# Patient Record
Sex: Male | Born: 1955
Health system: Southern US, Community
[De-identification: ages and names within clinical notes are randomized; demographics above are authoritative.]

## PROBLEM LIST (undated history)

## (undated) DIAGNOSIS — F411 Generalized anxiety disorder: Secondary | ICD-10-CM

## (undated) DIAGNOSIS — E781 Pure hyperglyceridemia: Secondary | ICD-10-CM

## (undated) DIAGNOSIS — I10 Essential (primary) hypertension: Secondary | ICD-10-CM

## (undated) DIAGNOSIS — K449 Diaphragmatic hernia without obstruction or gangrene: Secondary | ICD-10-CM

## (undated) HISTORY — DX: Generalized anxiety disorder: F41.1

## (undated) HISTORY — DX: Essential (primary) hypertension: I10

## (undated) HISTORY — DX: Diaphragmatic hernia without obstruction or gangrene: K44.9

## (undated) HISTORY — DX: Pure hyperglyceridemia: E78.1

---

## 2011-02-06 ENCOUNTER — Encounter: Payer: Self-pay | Admitting: Family Medicine

## 2011-02-06 DIAGNOSIS — I10 Essential (primary) hypertension: Secondary | ICD-10-CM | POA: Insufficient documentation

## 2011-10-02 ENCOUNTER — Ambulatory Visit (INDEPENDENT_AMBULATORY_CARE_PROVIDER_SITE_OTHER): Payer: BC Managed Care – PPO | Admitting: Family Medicine

## 2011-10-02 VITALS — BP 152/74 | HR 98 | Temp 97.9°F | Resp 18 | Ht 66.5 in | Wt 175.0 lb

## 2011-10-02 DIAGNOSIS — R3911 Hesitancy of micturition: Secondary | ICD-10-CM

## 2011-10-02 DIAGNOSIS — R3915 Urgency of urination: Secondary | ICD-10-CM

## 2011-10-02 LAB — POCT UA - MICROSCOPIC ONLY
Mucus, UA: POSITIVE
WBC, Ur, HPF, POC: NEGATIVE

## 2011-10-02 LAB — POCT URINALYSIS DIPSTICK
Bilirubin, UA: NEGATIVE
Ketones, UA: NEGATIVE
Leukocytes, UA: NEGATIVE
pH, UA: 7

## 2011-10-02 NOTE — Progress Notes (Signed)
  Subjective:    Patient ID: Paul Hensley, male    DOB: 1955/12/30, 56 y.o.   MRN: [id number]  HPI Patient presents for evaluation of polyuria. Polyuria longstanding "all my life" Recently with urge symptoms and sensation of incomplete emptying Urinates scant amount  Started on finasteride 2 months ago with initial improvement in symptoms. Prostate exam done at that time patient reports as normal   Review of Systems  Constitutional: Negative for fever, chills and fatigue.  Genitourinary: Positive for frequency. Negative for dysuria, hematuria, flank pain and discharge.       Objective:   Physical Exam  Constitutional: He appears well-developed.  HENT:  Mouth/Throat: Oropharynx is clear and moist.  Neck: Neck supple.  Cardiovascular: Normal rate, regular rhythm and normal heart sounds.   Pulmonary/Chest: Effort normal and breath sounds normal.  Abdominal: Soft. Bowel sounds are normal.  Genitourinary: Prostate normal. Prostate is not enlarged and not tender.  Musculoskeletal: Normal range of motion.      Results for orders placed in visit on 10/02/11  POCT UA - MICROSCOPIC ONLY      Component Value Range   WBC, Ur, HPF, POC negative     RBC, urine, microscopic 0-2     Bacteria, U Microscopic negative     Mucus, UA positive     Epithelial cells, urine per micros 0-1     Crystals, Ur, HPF, POC negative     Casts, Ur, LPF, POC negative     Yeast, UA negative    POCT URINALYSIS DIPSTICK      Component Value Range   Color, UA yellow     Clarity, UA clear     Glucose, UA negative     Bilirubin, UA negative     Ketones, UA negative     Spec Grav, UA 1.015     Blood, UA trace-lysed     pH, UA 7.0     Protein, UA negative     Urobilinogen, UA 0.2     Nitrite, UA negative     Leukocytes, UA Negative    PSA      Component Value Range   PSA 0.69  <=4.00 (ng/mL)       Assessment & Plan:   1. Urinary urgency  POCT UA - Microscopic Only, POCT urinalysis dipstick,  PSA   Trial of Flomax to see if symptoms secondary to obstruction.  If no relief patient will need to see Urology for cystoscopy to discern etiology of irritative voiding symptoms.  Patient in agreement with plan.

## 2011-10-03 LAB — PSA: PSA: 0.69 ng/mL (ref <=4.00)

## 2011-10-03 MED ORDER — TAMSULOSIN HCL 0.4 MG PO CAPS: 0.4000 mg | ORAL_CAPSULE | Freq: Every day | ORAL | Status: DC

## 2011-10-06 ENCOUNTER — Telehealth: Payer: Self-pay

## 2011-10-06 NOTE — Telephone Encounter (Signed)
Dr. Hal Hope gave pt Flomax the other day during OV. He would like to if he can take his other med (Finasteride) with Flomax. He stated that he was uncomfortable.

## 2011-10-06 NOTE — Telephone Encounter (Signed)
Yes ok to take.  Paul Hensley

## 2011-10-06 NOTE — Telephone Encounter (Signed)
Spoke with pt advised its ok to take both meds. Pt understood

## 2012-10-24 ENCOUNTER — Ambulatory Visit: Payer: BC Managed Care – PPO

## 2012-11-24 ENCOUNTER — Encounter: Payer: Self-pay | Admitting: Family Medicine

## 2012-11-24 ENCOUNTER — Ambulatory Visit (INDEPENDENT_AMBULATORY_CARE_PROVIDER_SITE_OTHER): Payer: BC Managed Care – PPO | Admitting: Family Medicine

## 2012-11-24 VITALS — BP 120/70 | HR 80 | Temp 97.9°F | Resp 20 | Ht 66.0 in | Wt 173.0 lb

## 2012-11-24 DIAGNOSIS — E785 Hyperlipidemia, unspecified: Secondary | ICD-10-CM

## 2012-11-24 DIAGNOSIS — I10 Essential (primary) hypertension: Secondary | ICD-10-CM

## 2012-11-24 DIAGNOSIS — N400 Enlarged prostate without lower urinary tract symptoms: Secondary | ICD-10-CM

## 2012-11-24 DIAGNOSIS — M722 Plantar fascial fibromatosis: Secondary | ICD-10-CM

## 2012-11-24 DIAGNOSIS — L259 Unspecified contact dermatitis, unspecified cause: Secondary | ICD-10-CM

## 2012-11-24 DIAGNOSIS — E781 Pure hyperglyceridemia: Secondary | ICD-10-CM | POA: Insufficient documentation

## 2012-11-24 LAB — CBC WITH DIFFERENTIAL/PLATELET
Basophils Relative: 0 % (ref 0–1)
Eosinophils Absolute: 0.3 10*3/uL (ref 0.0–0.7)
Eosinophils Relative: 2 % (ref 0–5)
HCT: 42.6 % (ref 39.0–52.0)
Hemoglobin: 14.9 g/dL (ref 13.0–17.0)
MCH: 28.7 pg (ref 26.0–34.0)
MCHC: 35 g/dL (ref 30.0–36.0)
MCV: 81.9 fL (ref 78.0–100.0)
Monocytes Absolute: 0.7 10*3/uL (ref 0.1–1.0)
Monocytes Relative: 6 % (ref 3–12)
Neutrophils Relative %: 79 % — ABNORMAL HIGH (ref 43–77)

## 2012-11-24 LAB — BASIC METABOLIC PANEL
CO2: 29 mEq/L (ref 19–32)
Chloride: 97 mEq/L (ref 96–112)
Creat: 1 mg/dL (ref 0.50–1.35)
Potassium: 4.1 mEq/L (ref 3.5–5.3)

## 2012-11-24 LAB — HEPATIC FUNCTION PANEL
ALT: 21 U/L (ref 0–53)
AST: 19 U/L (ref 0–37)
Albumin: 4.3 g/dL (ref 3.5–5.2)
Alkaline Phosphatase: 68 U/L (ref 39–117)
Total Protein: 6.3 g/dL (ref 6.0–8.3)

## 2012-11-24 LAB — PSA: PSA: 1.3 ng/mL (ref <=4.00)

## 2012-11-24 LAB — LIPID PANEL: Cholesterol: 140 mg/dL (ref 0–200)

## 2012-11-24 MED ORDER — MOMETASONE FUROATE 0.1 % EX OINT: TOPICAL_OINTMENT | Freq: Every day | CUTANEOUS | Status: DC

## 2012-11-24 MED ORDER — SIMVASTATIN 20 MG PO TABS: 20.0000 mg | ORAL_TABLET | Freq: Every day | ORAL | Status: DC

## 2012-11-24 MED ORDER — FINASTERIDE 1 MG PO TABS: 5.0000 mg | ORAL_TABLET | Freq: Every day | ORAL | Status: DC

## 2012-11-24 MED ORDER — LISINOPRIL-HYDROCHLOROTHIAZIDE 20-12.5 MG PO TABS: 1.0000 | ORAL_TABLET | Freq: Every day | ORAL | Status: DC

## 2012-11-24 NOTE — Progress Notes (Signed)
Subjective:    Patient ID: Paul Hensley, male    DOB: 02/01/1956, 57 y.o.   MRN: [id number]  HPI  Patient reports that 2 weeks ago he was bit on the back of his neck by some type of insect..  he felt a sharp stinging pain. Ever since he has had an itchy red rash. It is 1 cm in diameter. It is red scaly skin which appears like eczema.  He's tried topical calamine lotion without benefit.  He also has hypertension. He can't take fosinopril 20 mg by mouth daily and hydrochlorothiazide 25 mg by mouth daily. He denies any chest pain, shortness of breath, or dyspnea on exertion.  He also has hyperlipidemia. He currently takes Zocor 20 mg by mouth daily. He denies any myalgias or right upper quadrant pain.  He reports chronic pain in his left foot. It is located on the plantar aspect of his foot near the calcaneus. It is worse with prolonged standing on ladders. It is worse with prolonged walking. He's tried orthotics factors without benefit. 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 Past Medical History  Diagnosis Date  . Hypertension   . Hiatal hernia   . GAD (generalized anxiety disorder)   . High triglycerides    Current Outpatient Prescriptions on File Prior to Visit  Medication Sig Dispense Refill  . fish oil-omega-3 fatty acids 1000 MG capsule Take 2 g by mouth daily.        . fluocinonide (LIDEX) 0.05 % cream Apply topically 2 (two) times daily.        . mometasone (ELOCON) 0.1 % cream Apply topically daily.        Marland Kitchen omeprazole (PRILOSEC) 20 MG capsule Take 20 mg by mouth daily.         No current facility-administered medications on file prior to visit.   Allergies  Allergen Reactions  . Effexor (Venlafaxine Hydrochloride)      Review of Systems  All other systems reviewed and are negative.       Objective:   Physical Exam  Constitutional: He appears well-developed and well-nourished.  Neck: Normal range of motion. Neck supple. No thyromegaly present.   Cardiovascular: Normal rate, regular rhythm and normal heart sounds.  Exam reveals no gallop and no friction rub.   No murmur heard. Pulmonary/Chest: Effort normal and breath sounds normal. No respiratory distress. He has no wheezes. He has no rales. He exhibits no tenderness.  Abdominal: Soft. Bowel sounds are normal. He exhibits no distension. There is no tenderness. There is no rebound and no guarding.  Musculoskeletal: Normal range of motion. He exhibits tenderness. He exhibits no edema.  Lymphadenopathy:    He has no cervical adenopathy.   he is tender palpation over the medial plantar aspect of the left calcaneus near the insertion of a plantar fascia.        Assessment & Plan:  1. Contact dermatitis Elocon 0.1% ointment to be applied daily for 7-10 days to the back of his neck. If the lesion persists would recommend a skin biopsy versus a KOH. 2. BPH (benign prostatic hyperplasia) Patient has mild BPH. He mainly takes finasteride for male pattern baldness. His only taking 1 mg a day. I will recheck a PSA for cancer screening. The patient refuses a rectal exam today. - PSA  3. HTN (hypertension) Patient's blood pressures well controlled continue the current medications the - Basic Metabolic Panel - CBC with Differential - Hepatic Function Panel - Lipid Panel  4. HLD (hyperlipidemia) Check fasting lipid panel. Goal LDL is less than 1:30.  5. Plantar fasciitis of left foot Offered the patient a cortisone injection in the left heel. He would like to come back Friday when he is  not having to work all day after the injection. We can perform it at that time.

## 2012-12-05 ENCOUNTER — Encounter: Payer: Self-pay | Admitting: Family Medicine

## 2012-12-05 ENCOUNTER — Ambulatory Visit (INDEPENDENT_AMBULATORY_CARE_PROVIDER_SITE_OTHER): Payer: BC Managed Care – PPO | Admitting: Family Medicine

## 2012-12-05 VITALS — BP 120/60 | HR 78 | Temp 98.1°F | Resp 16 | Wt 172.0 lb

## 2012-12-05 DIAGNOSIS — M722 Plantar fascial fibromatosis: Secondary | ICD-10-CM

## 2012-12-05 NOTE — Progress Notes (Signed)
  Subjective:    Patient ID: Paul Hensley, male    DOB: 1955/10/23, 57 y.o.   MRN: [id number]  HPI  He reports chronic pain in his left foot. It is located on the plantar aspect of his foot near the calcaneus. It is worse with prolonged standing on ladders. It is worse with prolonged walking. He's tried orthotics factors without benefit. 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  He is here to get the cortisone injection in his left heel that we discussed at last visit. Past Medical History  Diagnosis Date  . Hypertension   . Hiatal hernia   . GAD (generalized anxiety disorder)   . High triglycerides    Current Outpatient Prescriptions on File Prior to Visit  Medication Sig Dispense Refill  . finasteride (PROPECIA) 1 MG tablet Take 5 tablets (5 mg total) by mouth daily.  30 tablet  11  . fish oil-omega-3 fatty acids 1000 MG capsule Take 2 g by mouth daily.        Marland Kitchen lisinopril-hydrochlorothiazide (ZESTORETIC) 20-12.5 MG per tablet Take 1 tablet by mouth daily.  30 tablet  11  . mometasone (ELOCON) 0.1 % ointment Apply topically daily.  45 g  0  . omeprazole (PRILOSEC) 20 MG capsule Take 20 mg by mouth daily.        . simvastatin (ZOCOR) 20 MG tablet Take 1 tablet (20 mg total) by mouth at bedtime.  30 tablet  11   No current facility-administered medications on file prior to visit.   Allergies  Allergen Reactions  . Effexor (Venlafaxine Hydrochloride)       Review of Systems  All other systems reviewed and are negative.       Objective:   Physical Exam  Vitals reviewed. Cardiovascular: Normal rate and regular rhythm.   No murmur heard. Pulmonary/Chest: Effort normal and breath sounds normal.  Musculoskeletal: He exhibits tenderness.  tender to palpation over the left calcaneus near the plantar fascia insertion.        Assessment & Plan:  1. Plantar fasciitis of left foot Using sterile technique a combination of 1 cc lidocaine and 1 cc of 40 mg per mL  Kenalog was injected near the point of maximum tenderness on the inner aspect of his left calcaneus. The patient tolerated procedure well. The wound is dressed with a Band-Aid. Wound precautions were given. Followup in one to 2 weeks.

## 2012-12-12 ENCOUNTER — Ambulatory Visit: Payer: Self-pay

## 2012-12-12 ENCOUNTER — Other Ambulatory Visit: Payer: Self-pay | Admitting: Occupational Medicine

## 2012-12-12 DIAGNOSIS — R52 Pain, unspecified: Secondary | ICD-10-CM

## 2013-04-06 ENCOUNTER — Telehealth: Payer: Self-pay | Admitting: Family Medicine

## 2013-04-06 NOTE — Telephone Encounter (Signed)
Switch to pravastatin 40 qd.

## 2013-04-06 NOTE — Telephone Encounter (Signed)
Insurance will no longer pay for Simvastatin. Can we change to something cheaper?

## 2013-04-07 MED ORDER — PRAVASTATIN SODIUM 40 MG PO TABS: 40.0000 mg | ORAL_TABLET | Freq: Every day | ORAL | Status: DC

## 2013-04-07 NOTE — Telephone Encounter (Signed)
.  Rx sent to pharm and pt aware per vm

## 2013-07-07 ENCOUNTER — Ambulatory Visit (INDEPENDENT_AMBULATORY_CARE_PROVIDER_SITE_OTHER): Payer: 59 | Admitting: Internal Medicine

## 2013-07-07 VITALS — BP 132/64 | HR 83 | Resp 16 | Ht 67.5 in | Wt 168.0 lb

## 2013-07-07 DIAGNOSIS — R300 Dysuria: Secondary | ICD-10-CM

## 2013-07-07 DIAGNOSIS — R110 Nausea: Secondary | ICD-10-CM

## 2013-07-07 DIAGNOSIS — G4700 Insomnia, unspecified: Secondary | ICD-10-CM

## 2013-07-07 DIAGNOSIS — N400 Enlarged prostate without lower urinary tract symptoms: Secondary | ICD-10-CM

## 2013-07-07 DIAGNOSIS — J111 Influenza due to unidentified influenza virus with other respiratory manifestations: Secondary | ICD-10-CM

## 2013-07-07 LAB — POCT URINALYSIS DIPSTICK
Blood, UA: NEGATIVE
Glucose, UA: NEGATIVE
Ketones, UA: >=160
Leukocytes, UA: NEGATIVE
Nitrite, UA: NEGATIVE

## 2013-07-07 LAB — POCT UA - MICROSCOPIC ONLY

## 2013-07-07 MED ORDER — TAMSULOSIN HCL 0.4 MG PO CAPS: 0.4000 mg | ORAL_CAPSULE | Freq: Every day | ORAL | Status: DC

## 2013-07-07 MED ORDER — ONDANSETRON HCL 4 MG PO TABS: 4.0000 mg | ORAL_TABLET | Freq: Three times a day (TID) | ORAL | Status: DC | PRN

## 2013-07-07 NOTE — Progress Notes (Addendum)
This chart was scribed for Ellamae Sia, MD by Joaquin Music, ED Scribe. This patient was seen in room Room/bed 4 and the patient's care was started at 11:58 AM. Subjective:    Patient ID: Paul Hensley, male    DOB: 25-Mar-1956, 57 y.o.   MRN: [id number] Chief Complaint  Patient presents with  . GI Problem   GI Problem The primary symptoms include dysuria. Primary symptoms do not include fever.   ED MANDICH is a 57 y.o. male who presents to [facility] complaining of illness for the last 5 days. Pt states he initially began having HA, sinus congestiong and cough, all of which have resolved. Pt states he has now noticed he has been having urinary symptoms. He states he has been having dysuria, trouble starting/stopping his stream with infrequent voiding due to decrease fluid intake. He has lost his appetite over the last 72 hours and has had frequent nausea without vomiting for the last 2 days.  Pt denies fever,  and diarrhea.  Pt has a hx of similar voiding symptoms that occured a year ago. He states he was referred to [facility] and was informed he had excessive intake of caffeine with BPH. He improved with Flomax and changing his diet. Pt states he has done well since then with regular fluids and avoiding caffeine until recent symptoms.  He also complains of insomnia. He has delayed sleep onset insisting as are having his mind calmed down. He lives with his wife but says it is a lot happier at work than he is at home. He has tried Benadryl without success .  History   Social History  . Marital Status: Married    Spouse Name: N/A    Number of Children: N/A  . Years of Education: N/A   Social History Main Topics  . Smoking status: Former Smoker    Types: Cigarettes    Quit date: 10/02/1971  . Smokeless tobacco: Never Used  . Alcohol Use: No  . Drug Use: No  . Sexual Activity: None   Other Topics Concern  . None   Social History Narrative  . None   No past  surgical history on file.  No family history on file. Patient Active Problem List   Diagnosis Date Noted  . BPH (benign prostatic hyperplasia) 07/07/2013  . High triglycerides   . Hypertension     Current outpatient prescriptions:finasteride (PROPECIA) 1 MG tablet, Take 5 tablets (5 mg total) by mouth daily., Disp: 30 tablet, Rfl: 11;  fish oil-omega-3 fatty acids 1000 MG capsule, Take 2 g by mouth daily.  , Disp: , Rfl: ;  lisinopril-hydrochlorothiazide (ZESTORETIC) 20-12.5 MG per tablet, Take 1 tablet by mouth daily., Disp: 30 tablet, Rfl: 11;  omeprazole (PRILOSEC) 20 MG capsule, Take 20 mg by mouth daily.  , Disp: , Rfl:  pravastatin (PRAVACHOL) 40 MG tablet, Take 1 tablet (40 mg total) by mouth daily., Disp: 30 tablet, Rfl: 5  Review of Systems  Constitutional: Negative for fever and unexpected weight change.  Respiratory: Negative for chest tightness and shortness of breath.   Cardiovascular: Negative for chest pain and palpitations.  Genitourinary: Positive for dysuria, decreased urine volume and difficulty urinating.   Objective:   Physical Exam CONSTITUTIONAL: Well developed/well nourished HEAD: Normocephalic/atraumatic EYES: EOMI/PERRL/conjunctiva clear ENMT: Mucous membranes moist/no nasal discharge/oral pharynx clear NECK: supple no meningeal signs/no adenopathy SPINE:entire spine nontender CV: S1/S2 noted, no murmurs/rubs/gallops noted LUNGS: Lungs are clear to auscultation bilaterally, no apparent distress ABDOMEN: soft,  nontender, no rebound or guarding GU:no cva tenderness NEURO: Pt is awake/alert, moves all extremitiesx4 EXTREMITIES: pulses normal, full ROM SKIN: warm, color normal PSYCH: no abnormalities of mood noted  Results for orders placed in visit on 07/07/13  POCT URINALYSIS DIPSTICK      Result Value Range   Color, UA amber     Clarity, UA sl cloudy     Glucose, UA neg     Bilirubin, UA small     Ketones, UA >=160     Spec Grav, UA 1.025      Blood, UA neg     pH, UA 6.0     Protein, UA 30     Urobilinogen, UA 1.0     Nitrite, UA neg     Leukocytes, UA Negative    POCT UA - MICROSCOPIC ONLY      Result Value Range   WBC, Ur, HPF, POC 0-2     RBC, urine, microscopic 1-3     Bacteria, U Microscopic 1+     Mucus, UA positive'     Epithelial cells, urine per micros 0-1     Crystals, Ur, HPF, POC neg     Casts, Ur, LPF, POC neg     Yeast, UA neg      BP 132/64  Pulse 83  Resp 16  Ht 5' 7.5" (1.715 m)  Wt 168 lb (76.204 kg)  BMI 25.91 kg/m2  SpO2 99% Assessment & Plan:  Dysuria - Plan: POCT urinalysis dipstick, POCT UA - Microscopic Only  Influenza with other respiratory manifestations  Nausea alone  Insomnia  BPH (benign prostatic hyperplasia)  Meds ordered this encounter  Medications  . tamsulosin (FLOMAX) 0.4 MG CAPS capsule    Sig: Take 1 capsule (0.4 mg total) by mouth daily after supper.    Dispense:  10 capsule    Refill:  0  . ondansetron (ZOFRAN) 4 MG tablet    Sig: Take 1 tablet (4 mg total) by mouth every 8 (eight) hours as needed for nausea or vomiting.    Dispense:  10 tablet    Refill:  0   If not improved after one week to 2 weeks and advise recheck Trial of melatonin or valerian root at hs May need counseling intervention     I personally performed the services described in this documentation, which was scribed in my presence. The recorded information has been reviewed and is accurate.

## 2013-07-31 ENCOUNTER — Other Ambulatory Visit: Payer: Self-pay | Admitting: Family Medicine

## 2013-10-01 ENCOUNTER — Other Ambulatory Visit: Payer: Self-pay | Admitting: Family Medicine

## 2013-10-02 NOTE — Telephone Encounter (Signed)
Refill appropriate and filled per protocol. 

## 2013-10-26 ENCOUNTER — Ambulatory Visit (INDEPENDENT_AMBULATORY_CARE_PROVIDER_SITE_OTHER): Payer: 59 | Admitting: Family Medicine

## 2013-10-26 ENCOUNTER — Encounter: Payer: Self-pay | Admitting: Family Medicine

## 2013-10-26 VITALS — BP 138/70 | HR 98 | Temp 97.0°F | Resp 16 | Ht 67.0 in | Wt 173.0 lb

## 2013-10-26 DIAGNOSIS — N400 Enlarged prostate without lower urinary tract symptoms: Secondary | ICD-10-CM

## 2013-10-26 DIAGNOSIS — E785 Hyperlipidemia, unspecified: Secondary | ICD-10-CM

## 2013-10-26 DIAGNOSIS — I10 Essential (primary) hypertension: Secondary | ICD-10-CM

## 2013-10-26 LAB — LIPID PANEL
CHOL/HDL RATIO: 3.5 ratio
Cholesterol: 142 mg/dL (ref 0–200)
HDL: 41 mg/dL (ref >39)
LDL CALC: 81 mg/dL (ref 0–99)
Triglycerides: 102 mg/dL (ref <150)
VLDL: 20 mg/dL (ref 0–40)

## 2013-10-26 LAB — CBC WITH DIFFERENTIAL/PLATELET
Basophils Absolute: 0 10*3/uL (ref 0.0–0.1)
Basophils Relative: 0 % (ref 0–1)
Eosinophils Absolute: 0.3 10*3/uL (ref 0.0–0.7)
Eosinophils Relative: 4 % (ref 0–5)
HCT: 45.4 % (ref 39.0–52.0)
Hemoglobin: 15.8 g/dL (ref 13.0–17.0)
LYMPHS ABS: 1.8 10*3/uL (ref 0.7–4.0)
LYMPHS PCT: 29 % (ref 12–46)
MCH: 29.3 pg (ref 26.0–34.0)
MCHC: 34.8 g/dL (ref 30.0–36.0)
MCV: 84.2 fL (ref 78.0–100.0)
Monocytes Absolute: 0.5 10*3/uL (ref 0.1–1.0)
Monocytes Relative: 8 % (ref 3–12)
Neutro Abs: 3.7 10*3/uL (ref 1.7–7.7)
Neutrophils Relative %: 59 % (ref 43–77)
Platelets: 303 10*3/uL (ref 150–400)
RBC: 5.39 MIL/uL (ref 4.22–5.81)
RDW: 13.3 % (ref 11.5–15.5)
WBC: 6.3 10*3/uL (ref 4.0–10.5)

## 2013-10-26 LAB — COMPLETE METABOLIC PANEL WITH GFR
ALK PHOS: 65 U/L (ref 39–117)
ALT: 33 U/L (ref 0–53)
AST: 23 U/L (ref 0–37)
Albumin: 4.1 g/dL (ref 3.5–5.2)
BILIRUBIN TOTAL: 0.4 mg/dL (ref 0.2–1.2)
BUN: 16 mg/dL (ref 6–23)
CO2: 28 mEq/L (ref 19–32)
CREATININE: 1 mg/dL (ref 0.50–1.35)
Calcium: 9.4 mg/dL (ref 8.4–10.5)
Chloride: 100 mEq/L (ref 96–112)
GFR, Est African American: >89 mL/min
GFR, Est Non African American: 83 mL/min
Glucose, Bld: 90 mg/dL (ref 70–99)
Potassium: 4.2 mEq/L (ref 3.5–5.3)
SODIUM: 136 meq/L (ref 135–145)
TOTAL PROTEIN: 6.2 g/dL (ref 6.0–8.3)

## 2013-10-26 MED ORDER — LISINOPRIL-HYDROCHLOROTHIAZIDE 20-12.5 MG PO TABS: 1.0000 | ORAL_TABLET | Freq: Every day | ORAL | Status: DC

## 2013-10-26 MED ORDER — LOVASTATIN 20 MG PO TABS: 20.0000 mg | ORAL_TABLET | Freq: Every day | ORAL | Status: DC

## 2013-10-26 MED ORDER — FINASTERIDE 5 MG PO TABS: ORAL_TABLET | ORAL | Status: DC

## 2013-10-26 MED ORDER — TAMSULOSIN HCL 0.4 MG PO CAPS: 0.4000 mg | ORAL_CAPSULE | Freq: Every day | ORAL | Status: DC

## 2013-10-26 NOTE — Progress Notes (Signed)
   Subjective:    Patient ID: Paul Hensley, male    DOB: Dec 13, 1955, 58 y.o.   MRN: [id number]  HPI Patient is here today for medicine check. He currently has hypertension. He is treated with Zestoretic 20/12.5 one by mouth daily. His blood pressures well-controlled at 138/70. He denies any chest pain shortness of breath or dyspnea on exertion. He also has hyperlipidemia which is treated pravastatin 40 mg by mouth daily. It would be cheaper for him to switch to lovastatin 20 mg by mouth daily. He denies any myalgias or right upper quadrant pain. He has not smoked in over 30 years. He is doing well. He does have BPH treated by his urologist. He is requesting a refill on his Flomax and finasteride. Past Medical History  Diagnosis Date  . Hypertension   . Hiatal hernia   . GAD (generalized anxiety disorder)   . High triglycerides    Current Outpatient Prescriptions on File Prior to Visit  Medication Sig Dispense Refill  . fish oil-omega-3 fatty acids 1000 MG capsule Take 2 g by mouth daily.        Marland Kitchen. omeprazole (PRILOSEC) 20 MG capsule Take 20 mg by mouth daily.        . pravastatin (PRAVACHOL) 40 MG tablet TAKE ONE TABLET BY MOUTH ONCE DAILY  30 tablet  0   No current facility-administered medications on file prior to visit.   No past surgical history on file. Allergies  Allergen Reactions  . Effexor [Venlafaxine Hydrochloride]    History   Social History  . Marital Status: Married    Spouse Name: N/A    Number of Children: N/A  . Years of Education: N/A   Occupational History  . Not on file.   Social History Main Topics  . Smoking status: Former Smoker    Types: Cigarettes    Quit date: 10/02/1971  . Smokeless tobacco: Never Used  . Alcohol Use: No  . Drug Use: No  . Sexual Activity: Not on file   Other Topics Concern  . Not on file   Social History Narrative  . No narrative on file      Review of Systems  All other systems reviewed and are negative.        Objective:   Physical Exam  Vitals reviewed. Constitutional: He appears well-developed and well-nourished.  Cardiovascular: Normal rate, regular rhythm, normal heart sounds and intact distal pulses.  Exam reveals no gallop and no friction rub.   No murmur heard. Pulmonary/Chest: Effort normal and breath sounds normal. No respiratory distress. He has no wheezes. He has no rales.  Abdominal: Soft. Bowel sounds are normal. He exhibits no distension. There is no tenderness. There is no rebound and no guarding.  Musculoskeletal: He exhibits no edema.          Assessment & Plan:  1. HTN (hypertension) Blood pressures well-controlled. Continue Zestoretic 20/12.5 one by mouth daily. Recheck in 6 months. - COMPLETE METABOLIC PANEL WITH GFR - CBC with Differential  2. HLD (hyperlipidemia) I will check a fasting lipid panel.  The LDL cholesterol is less than 130. If his LDL is 130 I will switch the patient from pravastatin to lovastatin 20 poqd - Lipid panel  3. BPH (benign prostatic hyperplasia) Currently asymptomatic. His PSA is being followed by his urologist. I refilled his medications.

## 2013-10-28 ENCOUNTER — Encounter: Payer: Self-pay | Admitting: Family Medicine

## 2014-01-29 ENCOUNTER — Telehealth: Payer: Self-pay | Admitting: Family Medicine

## 2014-01-29 MED ORDER — LISINOPRIL-HYDROCHLOROTHIAZIDE 20-12.5 MG PO TABS
1.0000 | ORAL_TABLET | Freq: Every day | ORAL | Status: DC
Start: 2014-01-29 — End: 2014-11-01

## 2014-01-29 NOTE — Telephone Encounter (Signed)
Med sent for 90 day supply for next refill

## 2014-01-29 NOTE — Telephone Encounter (Signed)
PATIENT WANTS TO LET US KNOW WHEN HIS BLOOD PRESSURE MEDICATION IS READY TO GET REFILLED HE SAYS WHICH IS SOON, THAT IT NEEDS TO BE 3 MONTH SUPPLY INSTEAD OF 4 MONTH SUPPLY BECAUSE IT IS CHEAPER  PHONE NUMBER TO REACH HIM IS [phone]

## 2014-05-09 ENCOUNTER — Encounter (HOSPITAL_COMMUNITY): Payer: Self-pay | Admitting: Emergency Medicine

## 2014-05-09 ENCOUNTER — Emergency Department (INDEPENDENT_AMBULATORY_CARE_PROVIDER_SITE_OTHER)
Admission: EM | Admit: 2014-05-09 | Discharge: 2014-05-09 | Disposition: A | Discharge status: Home / Self Care | Payer: Worker's Compensation | Source: Home / Self Care | Attending: Emergency Medicine | Admitting: Emergency Medicine

## 2014-05-09 DIAGNOSIS — S8001XA Contusion of right knee, initial encounter: Secondary | ICD-10-CM

## 2014-05-09 DIAGNOSIS — M7041 Prepatellar bursitis, right knee: Secondary | ICD-10-CM

## 2014-05-09 MED ORDER — TRIAMCINOLONE ACETONIDE 40 MG/ML IJ SUSP
40.0000 mg | Freq: Once | INTRAMUSCULAR | Status: AC
  Administered 2014-05-09: 40 mg via INTRAMUSCULAR

## 2014-05-09 MED ORDER — KETOROLAC TROMETHAMINE 60 MG/2ML IM SOLN
INTRAMUSCULAR | Status: AC
  Filled 2014-05-09: qty 2

## 2014-05-09 MED ORDER — TRAMADOL HCL 50 MG PO TABS: ORAL_TABLET | ORAL | Status: DC

## 2014-05-09 MED ORDER — KETOROLAC TROMETHAMINE 60 MG/2ML IM SOLN
60.0000 mg | Freq: Once | INTRAMUSCULAR | Status: AC
  Administered 2014-05-09: 60 mg via INTRAMUSCULAR

## 2014-05-09 MED ORDER — TRIAMCINOLONE ACETONIDE 40 MG/ML IJ SUSP
INTRAMUSCULAR | Status: AC
  Filled 2014-05-09: qty 1

## 2014-05-09 MED ORDER — DICLOFENAC SODIUM 1 % TD GEL: 1.0000 "application " | Freq: Four times a day (QID) | TRANSDERMAL | Status: DC

## 2014-05-09 NOTE — ED Provider Notes (Signed)
CSN: [id number]     Arrival date & time 05/09/14  [id number] History   First MD Initiated Contact with Patient 05/09/14 1003     Chief Complaint  Patient presents with  . 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 3 days ago he was crawling in a crawl space and bumped his right anterior knee on an object on the ground. He developed pain to the anterior knee at that time. He continued to crawl on his knees the remainder of the day for his remaining jobs. Over the ensuing 24-48 hours the pain is knees has progressed and he now has redness and tenderness over the patella. He is complaining of pain in limited flexion of the right knee.   Past Medical History  Diagnosis Date  . Hypertension   . Hiatal hernia   . GAD (generalized anxiety disorder)   . High triglycerides    History reviewed. No pertinent past surgical history. History reviewed. No pertinent family history. History  Substance Use Topics  . Smoking status: Former Smoker    Types: Cigarettes    Quit date: 10/02/1971  . Smokeless tobacco: Never Used  . Alcohol Use: No    Review of Systems  Constitutional: Negative.  Negative for fever.  Respiratory: Negative.   Musculoskeletal:       As per HPI  Skin: Positive for color change. Negative for rash and wound.  Neurological: Negative for dizziness, weakness, numbness and headaches.    Allergies  Effexor  Home Medications   Prior to Admission medications   Medication Sig Start Date End Date Taking? Authorizing Provider  diclofenac sodium (VOLTAREN) 1 % GEL Apply 1 application topically 4 (four) times daily. 05/09/14   Hayden Rasmussenavid Averil Digman, NP  finasteride (PROSCAR) 5 MG tablet TAKE ONE TABLET BY MOUTH EVERY DAY 10/26/13   Donita BrooksWarren T Pickard, MD  fish oil-omega-3 fatty acids 1000  MG capsule Take 2 g by mouth daily.      Historical Provider, MD  lisinopril-hydrochlorothiazide (ZESTORETIC) 20-12.5 MG per tablet Take 1 tablet by mouth daily. 01/29/14   Donita BrooksWarren T Pickard, MD  lovastatin (MEVACOR) 20 MG tablet Take 1 tablet (20 mg total) by mouth at bedtime. 10/26/13   Donita BrooksWarren T Pickard, MD  omeprazole (PRILOSEC) 20 MG capsule Take 20 mg by mouth daily.      Historical Provider, MD  pravastatin (PRAVACHOL) 40 MG tablet TAKE ONE TABLET BY MOUTH ONCE DAILY    Donita BrooksWarren T Pickard, MD  tamsulosin (FLOMAX) 0.4 MG CAPS capsule Take 1 capsule (0.4 mg total) by mouth daily after supper. 10/26/13   Donita BrooksWarren T Pickard, MD  traMADol (ULTRAM) 50 MG tablet 1-2 tabs po q 6 hr prn pain Maximum dose= 8 tablets per day 05/09/14   Hayden Rasmussenavid Achol Azpeitia, NP   BP 117/72 mmHg  Pulse 90  Temp(Src) 98 F (36.7 C) (Oral)  Resp 18  SpO2 98% Physical Exam  Constitutional: He is oriented to person, place, and time. He appears well-developed and well-nourished.  HENT:  Head: Normocephalic and atraumatic.  Eyes: EOM are normal. Left eye exhibits no discharge.  Neck: Normal range of motion. Neck supple.  Pulmonary/Chest: Effort normal. No respiratory distress.  Musculoskeletal:  Right anterior knee with erythema and tenderness and swelling primarily over the patella. The swelling and erythema  does not encompass the lateral or posterior aspect. There  is no lymphangitis. Flexion is limited to 100. Extension complete to 180. Distal neurovascular motor sensory is intact. Negative varus valgus, negative drawer and negative torsion produced pain. No laxity appreciated. There are no open wounds or signs of infection.  Neurological: He is alert and oriented to person, place, and time. No cranial nerve deficit.  Skin: Skin is warm and dry.  Psychiatric: He has a normal mood and affect.  Nursing note and vitals reviewed.   ED Course  Procedures (including critical care time) Labs Review Labs Reviewed - No data to  display  Imaging Review No results found.   MDM   1. Prepatellar bursitis, right   2. Knee contusion, right, initial encounter    Kenalog 40 mg IM and Toradol 60 mg IM now Diclofenac gel qid Tramadol 50 mg #20 Limit activity, no working on knees See PCP in 3 days      Hayden Rasmussenavid Lucan Riner, NP 05/09/14 1036

## 2014-05-09 NOTE — Discharge Instructions (Signed)
Prepatellar Bursitis with Rehab  Bursitis is a condition that is characterized by inflammation of a bursa. Bursa exists in many areas of the body. They are fluid-filled sacs that lie between a soft tissue (skin, tendon, or ligament) and a bone, and they reduce friction between the structures as well as the stress placed on the soft tissue. Prepatellar bursitis is inflammation of the bursa that lies between the skin and the kneecap (patella). This condition often causes pain over the patella. SYMPTOMS   Pain, tenderness, and/or inflammation over the patella.  Pain that worsens with movement of the knee joint.  Decreased range of motion for the knee joint.  A crackling sound (crepitation) when the bursa is moved or touched.  Occasionally, painless swelling of the bursa.  Fever (when infected). CAUSES  Bursitis is caused by damage to the bursa, which results in an inflammatory response. Common mechanisms of injury include:  Direct trauma to the front of the knee.  Repetitive and/or stressful use of the knee. RISK INCREASES WITH:  Activities in which kneeling and/or falling on one's knees is likely (volleyball or football).  Repetitive and stressful training, especially if it involves running on hills.  Improper training techniques, such as a sudden increase in the intensity, frequency, or duration of training.  Failure to warm up properly before activity.  Poor technique.  Artificial turf. PREVENTION   Avoid kneeling or falling on your knees.  Warm up and stretch properly before activity.  Allow for adequate recovery between workouts.  Maintain physical fitness:  Strength, flexibility, and endurance.  Cardiovascular fitness.  Learn and use proper technique. When possible, have a coach correct improper technique.  Wear properly fitted and padded protective equipment (kneepads). PROGNOSIS  If treated properly, then the symptoms of prepatellar bursitis usually resolve  within 2 weeks. RELATED COMPLICATIONS   Recurrent symptoms that result in a chronic problem.  Prolonged healing time, if improperly treated or reinjured.  Limited range of motion.  Infection of bursa.  Chronic inflammation or scarring of bursa. TREATMENT  Treatment initially involves the use of ice and medication to help reduce pain and inflammation. The use of strengthening and stretching exercises may help reduce pain with activity, especially those of the quadriceps and hamstring muscles. These exercises may be performed at home or with referral to a therapist. Your caregiver may recommend kneepads when you return to playing sports, in order to reduce the stress on the prepatellar bursa. If symptoms persist despite treatment, then your caregiver may drain fluid out with a needle (aspirate) the bursa. If symptoms persist for greater than 6 months despite nonsurgical (conservative) treatment, then surgery may be recommended to remove the bursa.  MEDICATION  If pain medication is necessary, then nonsteroidal anti-inflammatory medications, such as aspirin and ibuprofen, or other minor pain relievers, such as acetaminophen, are often recommended.  Do not take pain medication for 7 days before surgery.  Prescription pain relievers may be given if deemed necessary by your caregiver. Use only as directed and only as much as you need.  Corticosteroid injections may be given by your caregiver. These injections should be reserved for the most serious cases, because they may only be given a certain number of times. HEAT AND COLD  Cold treatment (icing) relieves pain and reduces inflammation. Cold treatment should be applied for 10 to 15 minutes every 2 to 3 hours for inflammation and pain and immediately after any activity that aggravates your symptoms. Use ice packs or massage the area with   a piece of ice (ice massage).  Heat treatment may be used prior to performing the stretching and  strengthening activities prescribed by your caregiver, physical therapist, or athletic trainer. Use a heat pack or soak the injury in warm water. SEEK MEDICAL CARE IF:  Treatment seems to offer no benefit, or the condition worsens.  Any medications produce adverse side effects. EXERCISES RANGE OF MOTION (ROM) AND STRETCHING EXERCISES - Prepatellar Bursitis These exercises may help you when beginning to rehabilitate your injury. Your symptoms may resolve with or without further involvement from your physician, physical therapist or athletic trainer. While completing these exercises, remember:   Restoring tissue flexibility helps normal motion to return to the joints. This allows healthier, less painful movement and activity.  An effective stretch should be held for at least 30 seconds.  A stretch should never be painful. You should only feel a gentle lengthening or release in the stretched tissue. STRETCH - Hamstrings, Standing  Stand or sit and extend your right / left leg, placing your foot on a chair or foot stool  Keeping a slight arch in your low back and your hips straight forward.  Lead with your chest and lean forward at the waist until you feel a gentle stretch in the back of your right / left knee or thigh. (When done correctly, this exercise requires leaning only a small distance.)  Hold this position for __________ seconds. Repeat __________ times. Complete this stretch __________ times per day. STRETCH - Quadriceps, Prone   Lie on your stomach on a firm surface, such as a bed or padded floor.  Bend your right / left knee and grasp your ankle. If you are unable to reach, your ankle or pant leg, use a belt around your foot to lengthen your reach.  Gently pull your heel toward your buttocks. Your knee should not slide out to the side. You should feel a stretch in the front of your thigh and/or knee.  Hold this position for __________ seconds. Repeat __________ times.  Complete this stretch __________ times per day.  STRETCH - Hamstrings/Adductors, V-Sit   Sit on the floor with your legs extended in a large "V," keeping your knees straight.  With your head and chest upright, bend at your waist reaching for your right foot to stretch your left adductors.  You should feel a stretch in your left inner thigh. Hold for __________ seconds.  Return to the upright position to relax your leg muscles.  Continuing to keep your chest upright, bend straight forward at your waist to stretch your hamstrings.  You should feel a stretch behind both of your thighs and/or knees. Hold for __________ seconds.  Return to the upright position to relax your leg muscles.  Repeat steps 2 through 4. Repeat __________ times. Complete this exercise __________ times per day.  STRENGTHENING EXERCISES - Prepatellar Bursitis  These exercises may help you when beginning to rehabilitate your injury. They may resolve your symptoms with or without further involvement from your physician, physical therapist or athletic trainer. While completing these exercises, remember:  Muscles can gain both the endurance and the strength needed for everyday activities through controlled exercises.  Complete these exercises as instructed by your physician, physical therapist or athletic trainer. Progress the resistance and repetitions only as guided. STRENGTH - Quadriceps, Isometrics  Lie on your back with your right / left leg extended and your opposite knee bent.  Gradually tense the muscles in the front of your right / left   thigh. You should see either your kneecap slide up toward your hip or increased dimpling just above the knee. This motion will push the back of the knee down toward the floor/mat/bed on which you are lying.  Hold the muscle as tight as you can without increasing your pain for __________ seconds.  Relax the muscles slowly and completely in between each repetition. Repeat  __________ times. Complete this exercise __________ times per day.  STRENGTH - Quadriceps, Short Arcs   Lie on your back. Place a __________ inch towel roll under your knee so that the knee slightly bends.  Raise only your lower leg by tightening the muscles in the front of your thigh. Do not allow your thigh to rise.  Hold this position for __________ seconds. Repeat __________ times. Complete this exercise __________ times per day.  OPTIONAL ANKLE WEIGHTS: Begin with ____________________, but DO NOT exceed ____________________. Increase in1 lb/0.5 kg increments.  STRENGTH - Quadriceps, Straight Leg Raises  Quality counts! Watch for signs that the quadriceps muscle is working to insure you are strengthening the correct muscles and not "cheating" by substituting with healthier muscles.  Lay on your back with your right / left leg extended and your opposite knee bent.  Tense the muscles in the front of your right / left thigh. You should see either your kneecap slide up or increased dimpling just above the knee. Your thigh may even quiver.  Tighten these muscles even more and raise your leg 4 to 6 inches off the floor. Hold for __________ seconds.  Keeping these muscles tense, lower your leg.  Relax the muscles slowly and completely in between each repetition. Repeat __________ times. Complete this exercise __________ times per day.  STRENGTH - Quadriceps, Step-Ups   Use a thick book, step or step stool that is __________ inches tall.  Holding a wall or counter for balance only, not support.  Slowly step-up with your right / left foot, keeping your knee in line with your hip and foot. Do not allow your knee to bend so far that you cannot see your toes.  Slowly unlock your knee and lower yourself to the starting position. Your muscles, not gravity, should lower you. Repeat __________ times. Complete this exercise __________ times per day. Document Released: 06/25/2005 Document Revised:  11/09/2013 Document Reviewed: 10/07/2008 Exit[employer] Patient Information 2015 [employer]. This information is not intended to replace advice given to you by your health care provider. Make sure you discuss any questions you have with your health care provider.  

## 2014-05-09 NOTE — ED Notes (Signed)
Pt states that Friday 05/07/2014 he was working in a crawl space under a house and injured his right knee. Pt is in no acute distress at this time.

## 2014-06-04 ENCOUNTER — Emergency Department (INDEPENDENT_AMBULATORY_CARE_PROVIDER_SITE_OTHER)
Admission: EM | Admit: 2014-06-04 | Discharge: 2014-06-04 | Disposition: A | Discharge status: Home / Self Care | Payer: Worker's Compensation | Source: Home / Self Care | Attending: Emergency Medicine | Admitting: Emergency Medicine

## 2014-06-04 ENCOUNTER — Encounter (HOSPITAL_COMMUNITY): Payer: Self-pay | Admitting: Emergency Medicine

## 2014-06-04 DIAGNOSIS — M7041 Prepatellar bursitis, right knee: Secondary | ICD-10-CM

## 2014-06-04 NOTE — ED Provider Notes (Signed)
  Chief Complaint   Knee Injury   History of Present Illness   Paul Hensley is a 58 year old male who was here 3-1/2 weeks ago because of prepatellar bursitis of the right knee. 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 He kneeled on a stone, and thereafter had swelling and pain in the prepatellar area. He was seen here and diagnosed with prepatellar bursitis. This was a workers comp injury, since it happened while he was on the job. He was prescribed Voltaren, and told to limit crawling and kneeling. He finished up his anti-inflammatories and feels better, but the fluid hasn't gone. He denies any soreness, erythema, or heat. He's had no fever or chills.  Review of Systems   Other than as noted above, the patient denies any of the following symptoms: Systemic:  No fevers or chills. Musculoskeletal:  No arthritis, swelling, or joint pain.  Neurological:  No muscular weakness or paresthesias.  PMFSH   Past medical history, family history, social history, meds, and allergies were reviewed.   He has BPH and hypertension. He takes finasteride, lisinopril/Advicor thiazide, lovastatin, omeprazole, and Flomax.  Physical Examination   Vital signs:  BP 153/84 mmHg  Pulse 97  Temp(Src) 98.2 F (36.8 C) (Oral)  Resp 18  SpO2 98% Gen:  Alert and oriented times 3.  In no distress. Musculoskeletal: There is a small amount of fluid in the prepatellar bursa. There is no erythema, heat, or tenderness to palpation. The knee has a full range of motion with no pain or crepitus.   McMurray's test was negative.  Lachman's test was negative.  Anterior drawer test was negative.   Varus and valgus stress negative.  Able to do a straight leg raise. Otherwise, all joints had a full a ROM with no swelling, bruising or deformity.  No edema, pulses full. Extremities were warm and pink.  Capillary refill was brisk.  Skin:  Clear, warm and dry.  No rash. Neuro:  Alert  and oriented times 3.  Muscle strength was normal.  Sensation was intact to light touch.   [facility]   The knee was wrapped in an Ace wrap.  Assessment   The encounter diagnosis was Prepatellar bursitis, right.  The fluid will go away on its own, but it will take time. I advised him to limit kneeling and crawling in the meantime. I discussed tapping the bursa, drawing out fluid, injecting steroids, but I told him that this will get better on its own given enough time I would prefer to treat conservatively for now. He's return if no better in another 3 or 4 weeks.  Plan    1.  Meds:  The following meds were prescribed:   New Prescriptions   No medications on file    2.  Patient Education/Counseling:  The patient was given appropriate handouts, self care instructions, and instructed in symptomatic relief, including rest and activity, elevation, application of ice and compression.    3.  Follow up:  The patient was told to follow up here if no better in 3 to 4 weeks, or sooner if becoming worse in any way, and given some red flag symptoms such as worsening pain or neurological symptoms which would prompt immediate return.       Reuben Likesavid C Jamarie [place], MD 06/04/14 [phone]

## 2014-06-04 NOTE — ED Notes (Signed)
Recheck of right knee pain and swelling

## 2014-06-04 NOTE — Discharge Instructions (Signed)
Bursitis °Bursitis is a swelling and soreness (inflammation) of a fluid-filled sac (bursa) that overlies and protects a joint. It can be caused by injury, overuse of the joint, arthritis or infection. The joints most likely to be affected are the elbows, shoulders, hips and knees. °HOME CARE INSTRUCTIONS  °· Apply ice to the affected area for 15-20 minutes each hour while awake for 2 days. Put the ice in a plastic bag and place a towel between the bag of ice and your skin. °· Rest the injured joint as much as possible, but continue to put the joint through a full range of motion, 4 times per day. (The shoulder joint especially becomes rapidly "frozen" if not used.) When the pain lessens, begin normal slow movements and usual activities. °· Only take over-the-counter or prescription medicines for pain, discomfort or fever as directed by your caregiver. °· Your caregiver may recommend draining the bursa and injecting medicine into the bursa. This may help the healing process. °· Follow all instructions for follow-up with your caregiver. This includes any orthopedic referrals, physical therapy and rehabilitation. Any delay in obtaining necessary care could result in a delay or failure of the bursitis to heal and chronic pain. °SEEK IMMEDIATE MEDICAL CARE IF:  °· Your pain increases even during treatment. °· You develop an oral temperature above 102° F (38.9° C) and have heat and inflammation over the involved bursa. °MAKE SURE YOU:  °· Understand these instructions. °· Will watch your condition. °· Will get help right away if you are not doing well or get worse. °Document Released: 06/22/2000 Document Revised: 09/17/2011 Document Reviewed: 09/14/2013 °[employer]® Patient Information ©2015 [employer]. This information is not intended to replace advice given to you by your health care provider. Make sure you discuss any questions you have with your health care provider. ° °

## 2014-10-18 ENCOUNTER — Encounter: Payer: Self-pay | Admitting: Family Medicine

## 2014-10-18 ENCOUNTER — Ambulatory Visit (INDEPENDENT_AMBULATORY_CARE_PROVIDER_SITE_OTHER): Payer: 59 | Admitting: Family Medicine

## 2014-10-18 VITALS — BP 122/70 | HR 80 | Temp 97.3°F | Resp 16 | Ht 67.0 in | Wt 176.0 lb

## 2014-10-18 DIAGNOSIS — E785 Hyperlipidemia, unspecified: Secondary | ICD-10-CM | POA: Diagnosis not present

## 2014-10-18 DIAGNOSIS — I10 Essential (primary) hypertension: Secondary | ICD-10-CM | POA: Diagnosis not present

## 2014-10-18 DIAGNOSIS — Z1211 Encounter for screening for malignant neoplasm of colon: Secondary | ICD-10-CM

## 2014-10-18 DIAGNOSIS — L290 Pruritus ani: Secondary | ICD-10-CM

## 2014-10-18 DIAGNOSIS — Z125 Encounter for screening for malignant neoplasm of prostate: Secondary | ICD-10-CM | POA: Diagnosis not present

## 2014-10-18 LAB — COMPLETE METABOLIC PANEL WITH GFR
ALT: 22 U/L (ref 0–53)
AST: 18 U/L (ref 0–37)
Albumin: 4.2 g/dL (ref 3.5–5.2)
Alkaline Phosphatase: 71 U/L (ref 39–117)
BUN: 12 mg/dL (ref 6–23)
CO2: 27 mEq/L (ref 19–32)
CREATININE: 1.1 mg/dL (ref 0.50–1.35)
Calcium: 9.5 mg/dL (ref 8.4–10.5)
Chloride: 98 mEq/L (ref 96–112)
GFR, EST AFRICAN AMERICAN: 84 mL/min
GFR, Est Non African American: 73 mL/min
Glucose, Bld: 100 mg/dL — ABNORMAL HIGH (ref 70–99)
Potassium: 4.1 mEq/L (ref 3.5–5.3)
Sodium: 135 mEq/L (ref 135–145)
Total Bilirubin: 0.9 mg/dL (ref 0.2–1.2)
Total Protein: 6.3 g/dL (ref 6.0–8.3)

## 2014-10-18 LAB — LIPID PANEL
CHOL/HDL RATIO: 3.9 ratio
CHOLESTEROL: 147 mg/dL (ref 0–200)
HDL: 38 mg/dL — ABNORMAL LOW (ref >=40)
LDL CALC: 87 mg/dL (ref 0–99)
Triglycerides: 108 mg/dL (ref <150)
VLDL: 22 mg/dL (ref 0–40)

## 2014-10-18 MED ORDER — OMEPRAZOLE 40 MG PO CPDR: 40.0000 mg | DELAYED_RELEASE_CAPSULE | Freq: Every day | ORAL | Status: DC

## 2014-10-18 MED ORDER — TAMSULOSIN HCL 0.4 MG PO CAPS: 0.4000 mg | ORAL_CAPSULE | Freq: Every day | ORAL | Status: DC

## 2014-10-18 MED ORDER — AMLODIPINE BESYLATE 10 MG PO TABS: 10.0000 mg | ORAL_TABLET | Freq: Every day | ORAL | Status: DC

## 2014-10-18 MED ORDER — LOVASTATIN 20 MG PO TABS: 20.0000 mg | ORAL_TABLET | Freq: Every day | ORAL | Status: DC

## 2014-10-18 MED ORDER — CLOTRIMAZOLE-BETAMETHASONE 1-0.05 % EX LOTN: TOPICAL_LOTION | Freq: Two times a day (BID) | CUTANEOUS | Status: DC

## 2014-10-18 NOTE — Progress Notes (Signed)
Subjective:    Paul Hensley ID: Paul Hensley, male    DOB: 1956-07-09, 59 y.o.   MRN: [id number]  HPI  Paul Hensley is a very pleasant 59 year old white male who is here today for a combination of reasons. #1 Paul Hensley has a history of hypertension. Paul Hensley is currently on Zestoretic 20/12.5 one by mouth daily. His blood pressures well controlled at 122/70. Paul Hensley denies any chest pain shortness of breath or dyspnea on exertion. Unfortunately Paul Hensley has nighttime cramps in his legs primarily in his calves and in his hamstring. Paul Hensley is directly correlated to the medication. If Paul Hensley does not take the medication Paul Hensley does not get the cramps. Paul Hensley is interested in switching off the medication due to that reason. Paul Hensley also has a history of hyperlipidemia. Paul Hensley is currently on lovastatin 20 mg by mouth daily. Paul Hensley denies any myalgias or right upper quadrant pain. Paul Hensley is overdue for fasting lipid panel. Paul Hensley is also overdue for a PSA to screen for prostate cancer. Paul Hensley also complains of rectal itching. Paul Hensley is tried over-the-counter hydrocortisone cream and this seems to irritate it more. On examination today Paul Hensley has a small external hemorrhoid at approximately 8:00. Paul Hensley also has perirectal erythema and swelling consistent with irritant dermatitis. Paul Hensley admits to having some rectal leakage throughout the day. The moisture seems to irritate the skin even more. When Paul Hensley uses a barrier cream such as zinc oxide the itching gets better. There is no evidence of a rectal fissure today on examination. Paul Hensley is overdue for colonoscopy. Past Medical History  Diagnosis Date  . Hypertension   . Hiatal hernia   . GAD (generalized anxiety disorder)   . High triglycerides    No past surgical history on file. Current Outpatient Prescriptions on File Prior to Visit  Medication Sig Dispense Refill  . finasteride (PROSCAR) 5 MG tablet TAKE ONE TABLET BY MOUTH EVERY DAY 30 tablet 3  . lisinopril-hydrochlorothiazide (ZESTORETIC) 20-12.5 MG per tablet Take 1 tablet by  mouth daily. 90 tablet 1  . fish oil-omega-3 fatty acids 1000 MG capsule Take 2 g by mouth daily.       No current facility-administered medications on file prior to visit.   Allergies  Allergen Reactions  . Effexor [Venlafaxine Hydrochloride]    History   Social History  . Marital Status: Married    Spouse Name: N/A  . Number of Children: N/A  . Years of Education: N/A   Occupational History  . Not on file.   Social History Main Topics  . Smoking status: Former Smoker    Types: Cigarettes    Quit date: 10/02/1971  . Smokeless tobacco: Never Used  . Alcohol Use: No  . Drug Use: No  . Sexual Activity: Not on file   Other Topics Concern  . Not on file   Social History Narrative     Review of Systems  All other systems reviewed and are negative.      Objective:   Physical Exam  Constitutional: Paul Hensley appears well-developed and well-nourished.  Cardiovascular: Normal rate, regular rhythm and normal heart sounds.  Exam reveals no gallop and no friction rub.   No murmur heard. Pulmonary/Chest: Effort normal and breath sounds normal. No respiratory distress. Paul Hensley has no wheezes. Paul Hensley has no rales.  Abdominal: Soft. Bowel sounds are normal. Paul Hensley exhibits no distension and no mass. There is no tenderness. There is no rebound and no guarding.  Genitourinary: Rectal exam shows external hemorrhoid. Rectal exam shows no internal hemorrhoid, no  fissure, no mass and no tenderness.  Vitals reviewed.         Assessment & Plan:  Benign essential HTN - Plan: amLODipine (NORVASC) 10 MG tablet  HLD (hyperlipidemia) - Plan: COMPLETE METABOLIC PANEL WITH GFR, Lipid panel  Prostate cancer screening - Plan: PSA  Rectal itching - Plan: clotrimazole-betamethasone (LOTRISONE) lotion  Colon cancer screening - Plan: Ambulatory referral to Gastroenterology  Paul Hensley's blood pressures well controlled. However I will discontinue the Zestoretic and switch the Paul Hensley to amlodipine 10 mg a day  for the cramping. Recheck blood pressure in one month. I will also check a fasting lipid panel. Goal LDL cholesterol is less than 130. To complete his prostate cancer screening I will check a PSA. To update his colon cancer screening I will schedule the Paul Hensley for colonoscopy. I believe the rectal itching is a irritant dermatitis due to fecal leakage throughout the day and moisture similar to a diaper rash. I will treat it with Lotrisone applied twice a day for 14 days to calm down the irritation as well as to treat any topical yeast. Recheck if no better after 2 weeks

## 2014-10-19 LAB — PSA: PSA: 0.92 ng/mL (ref <=4.00)

## 2014-10-28 ENCOUNTER — Encounter: Payer: Self-pay | Admitting: Family Medicine

## 2014-10-28 NOTE — Telephone Encounter (Signed)
Opened in error

## 2014-10-28 NOTE — Telephone Encounter (Signed)
This encounter was created in error - please disregard.

## 2014-11-01 ENCOUNTER — Telehealth: Payer: Self-pay | Admitting: Family Medicine

## 2014-11-01 MED ORDER — LOSARTAN POTASSIUM 100 MG PO TABS: 100.0000 mg | ORAL_TABLET | Freq: Every day | ORAL | Status: DC

## 2014-11-01 NOTE — Telephone Encounter (Signed)
Spoke to pt and he sates that the Amlodipine made his feet and legs swell so he stopped it and restarted the Lisinopril and now he has cramps in his feet but no swelling. Does not have any BP readings. Wants to know what to do?

## 2014-11-01 NOTE — Telephone Encounter (Signed)
Patient says that the amlodipine is not working and would like to talk to you regarding this     [phone]

## 2014-11-01 NOTE — Telephone Encounter (Signed)
Called pt back and he said he had just talked to someone.

## 2014-11-01 NOTE — Telephone Encounter (Signed)
Pt made awre fo medication change.  RX to pharmacy.  Med list updated.  Reminded patient needs one month follow up with fasting labs

## 2014-11-01 NOTE — Addendum Note (Signed)
Addended by: Donne AnonPLUMMER, KIM M on: 11/01/2014 03:45 PM   Modules accepted: Orders, Medications

## 2014-11-01 NOTE — Telephone Encounter (Signed)
Dc zestoretic and amlodipine and try cozaar 100 mg poqday.

## 2014-11-23 ENCOUNTER — Encounter: Payer: Self-pay | Admitting: Family Medicine

## 2014-11-23 ENCOUNTER — Ambulatory Visit (INDEPENDENT_AMBULATORY_CARE_PROVIDER_SITE_OTHER): Payer: 59 | Admitting: Family Medicine

## 2014-11-23 VITALS — BP 144/80 | HR 76 | Temp 97.9°F | Resp 16 | Ht 67.0 in | Wt 177.0 lb

## 2014-11-23 DIAGNOSIS — L219 Seborrheic dermatitis, unspecified: Secondary | ICD-10-CM

## 2014-11-23 DIAGNOSIS — I10 Essential (primary) hypertension: Secondary | ICD-10-CM | POA: Diagnosis not present

## 2014-11-23 MED ORDER — LOSARTAN POTASSIUM 100 MG PO TABS: 100.0000 mg | ORAL_TABLET | Freq: Every day | ORAL | Status: DC

## 2014-11-23 MED ORDER — ATENOLOL 50 MG PO TABS: 50.0000 mg | ORAL_TABLET | Freq: Every day | ORAL | Status: DC

## 2014-11-23 MED ORDER — KETOCONAZOLE 2 % EX SHAM
1.0000 | MEDICATED_SHAMPOO | CUTANEOUS | Status: DC
Start: 2014-11-23 — End: 2014-12-26

## 2014-11-23 NOTE — Progress Notes (Signed)
Subjective:    Patient ID: Paul Hensley, male    DOB: 04-25-1956, 59 y.o.   MRN: [id number]  HPI  10/18/14 Patient is a very pleasant 59 year old white male who is here today for a combination of reasons. #1 he has a history of hypertension. He is currently on Zestoretic 20/12.5 one by mouth daily. His blood pressures well controlled at 122/70. He denies any chest pain shortness of breath or dyspnea on exertion. Unfortunately he has nighttime cramps in his legs primarily in his calves and in his hamstring. He is directly correlated to the medication. If he does not take the medication he does not get the cramps. He is interested in switching off the medication due to that reason. He also has a history of hyperlipidemia. He is currently on lovastatin 20 mg by mouth daily. He denies any myalgias or right upper quadrant pain. He is overdue for fasting lipid panel. He is also overdue for a PSA to screen for prostate cancer. Patient also complains of rectal itching. He is tried over-the-counter hydrocortisone cream and this seems to irritate it more. On examination today he has a small external hemorrhoid at approximately 8:00. He also has perirectal erythema and swelling consistent with irritant dermatitis. Patient admits to having some rectal leakage throughout the day. The moisture seems to irritate the skin even more. When he uses a barrier cream such as zinc oxide the itching gets better. There is no evidence of a rectal fissure today on examination. He is overdue for colonoscopy.  AT that time, my plan was: Patient's blood pressures well controlled. However I will discontinue the Zestoretic and switch the patient to amlodipine 10 mg a day for the cramping. Recheck blood pressure in one month. I will also check a fasting lipid panel. Goal LDL cholesterol is less than 130. To complete his prostate cancer screening I will check a PSA. To update his colon cancer screening I will schedule the patient for  colonoscopy. I believe the rectal itching is a irritant dermatitis due to fecal leakage throughout the day and moisture similar to a diaper rash. I will treat it with Lotrisone applied twice a day for 14 days to calm down the irritation as well as to treat any topical yeast. Recheck if no better after 2 weeks  11/23/14 After stopping the Zestoretic, the patient's cramps subsided. However he has significant swelling on amlodipine and did not want to take the medication. We'll start the patient on losartan 100 mg by mouth daily instead. However his blood pressure is not ideal at 144/80. He also has a red scaly rash around both eyebrows, in his mustache, and around his nose and hairline consistent with seborrheic dermatitis. Past Medical History  Diagnosis Date  . Hypertension   . Hiatal hernia   . GAD (generalized anxiety disorder)   . High triglycerides    No past surgical history on file. Current Outpatient Prescriptions on File Prior to Visit  Medication Sig Dispense Refill  . clotrimazole-betamethasone (LOTRISONE) lotion Apply topically 2 (two) times daily. 30 mL 0  . finasteride (PROSCAR) 5 MG tablet TAKE ONE TABLET BY MOUTH EVERY DAY 30 tablet 3  . fish oil-omega-3 fatty acids 1000 MG capsule Take 2 g by mouth daily.      Marland Kitchen. lovastatin (MEVACOR) 20 MG tablet Take 1 tablet (20 mg total) by mouth at bedtime. 90 tablet 3  . omeprazole (PRILOSEC) 40 MG capsule Take 1 capsule (40 mg total) by mouth daily. 30  capsule 5  . tamsulosin (FLOMAX) 0.4 MG CAPS capsule Take 1 capsule (0.4 mg total) by mouth daily after supper. 30 capsule 11   No current facility-administered medications on file prior to visit.   Allergies  Allergen Reactions  . Effexor [Venlafaxine Hydrochloride]    History   Social History  . Marital Status: Married    Spouse Name: N/A  . Number of Children: N/A  . Years of Education: N/A   Occupational History  . Not on file.   Social History Main Topics  . Smoking  status: Former Smoker    Types: Cigarettes    Quit date: 10/02/1971  . Smokeless tobacco: Never Used  . Alcohol Use: No  . Drug Use: No  . Sexual Activity: Not on file   Other Topics Concern  . Not on file   Social History Narrative     Review of Systems  All other systems reviewed and are negative.      Objective:   Physical Exam  Constitutional: He appears well-developed and well-nourished.  Cardiovascular: Normal rate, regular rhythm and normal heart sounds.  Exam reveals no gallop and no friction rub.   No murmur heard. Pulmonary/Chest: Effort normal and breath sounds normal. No respiratory distress. He has no wheezes. He has no rales.  Abdominal: Soft. Bowel sounds are normal. He exhibits no distension and no mass. There is no tenderness. There is no rebound and no guarding.  Skin: Rash noted. There is erythema.  Vitals reviewed.         Assessment & Plan:  Benign essential HTN - Plan: atenolol (TENORMIN) 50 MG tablet  Seborrheic dermatitis - Plan: ketoconazole (NIZORAL) 2 % shampoo  I will add atenolol 50 mg by mouth daily to help manage his blood pressure. Continue losartan 100 mg by mouth daily. I also gave the patient Nizoral shampoo to be applied twice a week to affected areas. He can use over-the-counter hydrocortisone cream to calm irritation until the Nizoral shampoo has treated the underlying problem

## 2014-12-04 ENCOUNTER — Other Ambulatory Visit: Payer: Self-pay | Admitting: Family Medicine

## 2014-12-26 ENCOUNTER — Other Ambulatory Visit: Payer: Self-pay | Admitting: Family Medicine

## 2015-05-22 ENCOUNTER — Other Ambulatory Visit: Payer: Self-pay | Admitting: Family Medicine

## 2015-11-24 ENCOUNTER — Ambulatory Visit (INDEPENDENT_AMBULATORY_CARE_PROVIDER_SITE_OTHER): Payer: 59 | Admitting: Family Medicine

## 2015-11-24 ENCOUNTER — Encounter: Payer: Self-pay | Admitting: Family Medicine

## 2015-11-24 VITALS — BP 160/82 | HR 88 | Temp 97.9°F | Resp 14 | Ht 67.0 in | Wt 170.0 lb

## 2015-11-24 DIAGNOSIS — I10 Essential (primary) hypertension: Secondary | ICD-10-CM

## 2015-11-24 DIAGNOSIS — Z125 Encounter for screening for malignant neoplasm of prostate: Secondary | ICD-10-CM

## 2015-11-24 DIAGNOSIS — E785 Hyperlipidemia, unspecified: Secondary | ICD-10-CM | POA: Diagnosis not present

## 2015-11-24 LAB — LIPID PANEL
CHOL/HDL RATIO: 3.3 ratio (ref <=5.0)
Cholesterol: 134 mg/dL (ref 125–200)
HDL: 41 mg/dL (ref >=40)
LDL Cholesterol: 79 mg/dL (ref <130)
TRIGLYCERIDES: 70 mg/dL (ref <150)
VLDL: 14 mg/dL (ref <30)

## 2015-11-24 LAB — CBC WITH DIFFERENTIAL/PLATELET
BASOS ABS: 0 {cells}/uL (ref 0–200)
Basophils Relative: 0 %
EOS ABS: 156 {cells}/uL (ref 15–500)
Eosinophils Relative: 3 %
HEMATOCRIT: 46.3 % (ref 38.5–50.0)
HEMOGLOBIN: 15.8 g/dL (ref 13.0–17.0)
Lymphocytes Relative: 29 %
Lymphs Abs: 1508 cells/uL (ref 850–3900)
MCH: 29.8 pg (ref 27.0–33.0)
MCHC: 34.1 g/dL (ref 32.0–36.0)
MCV: 87.4 fL (ref 80.0–100.0)
MONO ABS: 416 {cells}/uL (ref 200–950)
MPV: 9 fL (ref 7.5–12.5)
Monocytes Relative: 8 %
NEUTROS PCT: 60 %
Neutro Abs: 3120 cells/uL (ref 1500–7800)
Platelets: 243 10*3/uL (ref 140–400)
RBC: 5.3 MIL/uL (ref 4.20–5.80)
RDW: 13.4 % (ref 11.0–15.0)
WBC: 5.2 10*3/uL (ref 3.8–10.8)

## 2015-11-24 LAB — COMPLETE METABOLIC PANEL WITH GFR
ALBUMIN: 4.1 g/dL (ref 3.6–5.1)
ALK PHOS: 78 U/L (ref 40–115)
ALT: 17 U/L (ref 9–46)
AST: 18 U/L (ref 10–35)
BUN: 14 mg/dL (ref 7–25)
CALCIUM: 8.6 mg/dL (ref 8.6–10.3)
CO2: 24 mmol/L (ref 20–31)
Chloride: 103 mmol/L (ref 98–110)
Creat: 0.88 mg/dL (ref 0.70–1.25)
GFR, Est Non African American: >89 mL/min (ref >=60)
Glucose, Bld: 95 mg/dL (ref 70–99)
POTASSIUM: 4 mmol/L (ref 3.5–5.3)
Sodium: 137 mmol/L (ref 135–146)
Total Bilirubin: 0.8 mg/dL (ref 0.2–1.2)
Total Protein: 6.1 g/dL (ref 6.1–8.1)

## 2015-11-24 MED ORDER — FINASTERIDE 5 MG PO TABS: 5.0000 mg | ORAL_TABLET | Freq: Every day | ORAL | Status: DC

## 2015-11-24 MED ORDER — LOVASTATIN 20 MG PO TABS: 20.0000 mg | ORAL_TABLET | Freq: Every day | ORAL | Status: DC

## 2015-11-24 MED ORDER — ATENOLOL 50 MG PO TABS: 50.0000 mg | ORAL_TABLET | Freq: Every day | ORAL | Status: DC

## 2015-11-24 MED ORDER — LOSARTAN POTASSIUM 100 MG PO TABS: 100.0000 mg | ORAL_TABLET | Freq: Every day | ORAL | Status: DC

## 2015-11-24 NOTE — Progress Notes (Signed)
   Subjective:    Patient ID: Paul LeatherwoodJimmy G Townsend, male    DOB: 1955-08-15, 60 y.o.   MRN: [id number]  HPI   Patient is a very pleasant 60 year old white male who is here today for a combination of reasons. #1 he has a history of hypertension. He denies any chest pain shortness of breath or dyspnea on exertion. He denies any myalgias or right upper quadrant pain. He is overdue for fasting lipid panel. He is also overdue for a PSA to screen for prostate cancer.  Past Medical History  Diagnosis Date  . Hypertension   . Hiatal hernia   . GAD (generalized anxiety disorder)   . High triglycerides    No past surgical history on file. Current Outpatient Prescriptions on File Prior to Visit  Medication Sig Dispense Refill  . clotrimazole-betamethasone (LOTRISONE) lotion Apply topically 2 (two) times daily. 30 mL 0  . ketoconazole (NIZORAL) 2 % shampoo APPLY APPLICATION TOPICALLY TWO TIMES A WEEK 120 mL 2  . fish oil-omega-3 fatty acids 1000 MG capsule Take 2 g by mouth daily. Reported on 11/24/2015    . omeprazole (PRILOSEC) 40 MG capsule TAKE ONE CAPSULE EVERY DAY (Patient not taking: Reported on 11/24/2015) 30 capsule 11  . tamsulosin (FLOMAX) 0.4 MG CAPS capsule Take 1 capsule (0.4 mg total) by mouth daily after supper. (Patient not taking: Reported on 11/24/2015) 30 capsule 11   No current facility-administered medications on file prior to visit.   Allergies  Allergen Reactions  . Effexor [Venlafaxine Hydrochloride]    Social History   Social History  . Marital Status: Married    Spouse Name: N/A  . Number of Children: N/A  . Years of Education: N/A   Occupational History  . Not on file.   Social History Main Topics  . Smoking status: Former Smoker    Types: Cigarettes    Quit date: 10/02/1971  . Smokeless tobacco: Never Used  . Alcohol Use: No  . Drug Use: No  . Sexual Activity: Not on file   Other Topics Concern  . Not on file   Social History Narrative     Review of  Systems  All other systems reviewed and are negative.      Objective:   Physical Exam  Constitutional: He appears well-developed and well-nourished.  Cardiovascular: Normal rate, regular rhythm and normal heart sounds.  Exam reveals no gallop and no friction rub.   No murmur heard. Pulmonary/Chest: Effort normal and breath sounds normal. No respiratory distress. He has no wheezes. He has no rales.  Abdominal: Soft. Bowel sounds are normal. He exhibits no distension and no mass. There is no tenderness. There is no rebound and no guarding.  Vitals reviewed.         Assessment & Plan:  Benign essential HTN - Plan: atenolol (TENORMIN) 50 MG tablet, CBC with Differential/Platelet, COMPLETE METABOLIC PANEL WITH GFR, Lipid panel  Prostate cancer screening - Plan: PSA  HLD (hyperlipidemia)  Patient's blood pressure is extremely high today but he has not taken the atenolol and several days. He is on losartan. I recommended that he resume the atenolol and recheck his blood pressure in 2 weeks. Meanwhile I will check a CMP and a fasting lipid panel. While checking lab work I will also check a PSA. He declines a digital rectal exam today

## 2015-11-25 ENCOUNTER — Encounter: Payer: Self-pay | Admitting: Family Medicine

## 2015-11-25 LAB — PSA: PSA: 0.72 ng/mL (ref <=4.00)

## 2015-12-02 ENCOUNTER — Ambulatory Visit (INDEPENDENT_AMBULATORY_CARE_PROVIDER_SITE_OTHER): Payer: 59 | Admitting: Family Medicine

## 2015-12-02 DIAGNOSIS — Z23 Encounter for immunization: Secondary | ICD-10-CM | POA: Diagnosis not present

## 2016-03-23 ENCOUNTER — Ambulatory Visit (INDEPENDENT_AMBULATORY_CARE_PROVIDER_SITE_OTHER): Payer: 59

## 2016-03-23 DIAGNOSIS — Z23 Encounter for immunization: Secondary | ICD-10-CM

## 2016-11-08 ENCOUNTER — Encounter: Payer: Self-pay | Admitting: Family Medicine

## 2016-11-08 ENCOUNTER — Ambulatory Visit (INDEPENDENT_AMBULATORY_CARE_PROVIDER_SITE_OTHER): Payer: 59 | Admitting: Family Medicine

## 2016-11-08 VITALS — BP 170/82 | HR 62 | Temp 97.8°F | Resp 14 | Ht 67.0 in | Wt 174.0 lb

## 2016-11-08 DIAGNOSIS — I10 Essential (primary) hypertension: Secondary | ICD-10-CM | POA: Diagnosis not present

## 2016-11-08 DIAGNOSIS — Z125 Encounter for screening for malignant neoplasm of prostate: Secondary | ICD-10-CM

## 2016-11-08 DIAGNOSIS — E781 Pure hyperglyceridemia: Secondary | ICD-10-CM | POA: Diagnosis not present

## 2016-11-08 LAB — CBC WITH DIFFERENTIAL/PLATELET
BASOS PCT: 1 %
Basophils Absolute: 63 cells/uL (ref 0–200)
EOS PCT: 3 %
Eosinophils Absolute: 189 cells/uL (ref 15–500)
HEMATOCRIT: 47.8 % (ref 38.5–50.0)
Hemoglobin: 15.7 g/dL (ref 13.0–17.0)
Lymphocytes Relative: 26 %
Lymphs Abs: 1638 cells/uL (ref 850–3900)
MCH: 28.5 pg (ref 27.0–33.0)
MCHC: 32.8 g/dL (ref 32.0–36.0)
MCV: 86.8 fL (ref 80.0–100.0)
MONO ABS: 567 {cells}/uL (ref 200–950)
MPV: 9.5 fL (ref 7.5–12.5)
Monocytes Relative: 9 %
Neutro Abs: 3843 cells/uL (ref 1500–7800)
Neutrophils Relative %: 61 %
PLATELETS: 246 10*3/uL (ref 140–400)
RBC: 5.51 MIL/uL (ref 4.20–5.80)
RDW: 13.5 % (ref 11.0–15.0)
WBC: 6.3 10*3/uL (ref 3.8–10.8)

## 2016-11-08 LAB — LIPID PANEL
CHOLESTEROL: 141 mg/dL (ref <200)
HDL: 42 mg/dL (ref >40)
LDL Cholesterol: 85 mg/dL (ref <100)
TRIGLYCERIDES: 72 mg/dL (ref <150)
Total CHOL/HDL Ratio: 3.4 Ratio (ref <5.0)
VLDL: 14 mg/dL (ref <30)

## 2016-11-08 LAB — COMPLETE METABOLIC PANEL WITH GFR
ALBUMIN: 4.1 g/dL (ref 3.6–5.1)
ALK PHOS: 71 U/L (ref 40–115)
ALT: 14 U/L (ref 9–46)
AST: 16 U/L (ref 10–35)
BUN: 18 mg/dL (ref 7–25)
CHLORIDE: 105 mmol/L (ref 98–110)
CO2: 23 mmol/L (ref 20–31)
Calcium: 9.1 mg/dL (ref 8.6–10.3)
Creat: 1.11 mg/dL (ref 0.70–1.25)
GFR, Est African American: 82 mL/min (ref >=60)
GFR, Est Non African American: 71 mL/min (ref >=60)
Glucose, Bld: 96 mg/dL (ref 70–99)
POTASSIUM: 4.3 mmol/L (ref 3.5–5.3)
Sodium: 140 mmol/L (ref 135–146)
Total Bilirubin: 1 mg/dL (ref 0.2–1.2)
Total Protein: 6.3 g/dL (ref 6.1–8.1)

## 2016-11-08 MED ORDER — DICLOFENAC SODIUM 75 MG PO TBEC: 75.0000 mg | DELAYED_RELEASE_TABLET | Freq: Two times a day (BID) | ORAL | 0 refills | Status: DC

## 2016-11-08 NOTE — Progress Notes (Signed)
Subjective:    Patient ID: Paul Hensley, male    DOB: Apr 22, 1956, 61 y.o.   MRN: [id number]  HPI  Patient is a very pleasant 61 year old white male who is here today for a combination of reasons. He has a history of hypertension. He denies any chest pain shortness of breath or dyspnea on exertion. His blood pressure today is extremely high 170/82. However he is being compliant with his medication.  He denies any myalgias or right upper quadrant pain. He is overdue for fasting lipid panel. He is also overdue for a PSA to screen for prostate cancer. He takes finasteride both for hair loss as well as BPH. He also complains of stiffness and his MTP joints on both feet. 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 The stiffness is better throughout the day but is unrelieved by ibuprofen or Naprosyn. He also reports occasional numbness in his right fourth and fifth digits whenever he squeezes a para pliers extremely hard. Numbness and tingling will radiate from his fourth and fifth digits up to his elbow. It only occurs when he is squeezing/gripping object extremely hard  Past Medical History:  Diagnosis Date  . GAD (generalized anxiety disorder)   . Hiatal hernia   . High triglycerides   . Hypertension    No past surgical history on file. Current Outpatient Prescriptions on File Prior to Visit  Medication Sig Dispense Refill  . atenolol (TENORMIN) 50 MG tablet Take 1 tablet (50 mg total) by mouth daily. 30 tablet 11  . finasteride (PROSCAR) 5 MG tablet Take 1 tablet (5 mg total) by mouth daily. 30 tablet 11  . losartan (COZAAR) 100 MG tablet Take 1 tablet (100 mg total) by mouth daily. 30 tablet 11  . lovastatin (MEVACOR) 20 MG tablet Take 1 tablet (20 mg total) by mouth at bedtime. 90 tablet 3  . fish oil-omega-3 fatty acids 1000 MG capsule Take 2 g by mouth daily. Reported on 11/24/2015     No current facility-administered medications on file  prior to visit.    Allergies  Allergen Reactions  . Effexor [Venlafaxine Hydrochloride]    Social History   Social History  . Marital status: Married    Spouse name: N/A  . Number of children: N/A  . Years of education: N/A   Occupational History  . Not on file.   Social History Main Topics  . Smoking status: Former Smoker    Types: Cigarettes    Quit date: 10/02/1971  . Smokeless tobacco: Never Used  . Alcohol use No  . Drug use: No  . Sexual activity: Not on file   Other Topics Concern  . Not on file   Social History Narrative  . No narrative on file     Review of Systems  All other systems reviewed and are negative.      Objective:   Physical Exam  Constitutional: He appears well-developed and well-nourished.  Cardiovascular: Normal rate, regular rhythm and normal heart sounds.  Exam reveals no gallop and no friction rub.   No murmur heard. Pulmonary/Chest: Effort normal and breath sounds normal. No respiratory distress. He has no wheezes. He has no rales.  Abdominal: Soft. Bowel sounds are normal. He exhibits no distension and no mass. There is no tenderness. There is no rebound and no guarding.  Vitals reviewed.         Assessment & Plan:  Benign essential HTN -  Plan: COMPLETE METABOLIC PANEL WITH GFR, CBC with Differential/Platelet, Lipid panel  Prostate cancer screening - Plan: PSA  High triglycerides  Patient's blood pressure is extremely high today.  I would like to add hydrochlorothiazide to the losartan to address his blood pressure. However I will check lab work first to monitor his kidney function as well as potassium. If the labs can tolerate it, I would then switch to losartan to Hyzaar to better manage his blood pressure. I suggested diclofenac 75 mg by mouth twice a day for the stiffness and pain in his MTP joint which is likely arthritis. I believe that the numbness and tingling and pain radiating from his fourth and fifth digit to his  elbow is due to ulnar neuropathy likely secondary to the flexor tendon group irritating the nerve at the elbow. I recommended job modifications to help try to treat this and prevent it from happening. I'll screen for prostate cancer the PSA I will also check a fasting lipid panel.

## 2016-11-09 LAB — PSA: PSA: 0.5 ng/mL (ref <=4.0)

## 2016-11-14 ENCOUNTER — Ambulatory Visit: Payer: 59 | Admitting: Family Medicine

## 2016-11-14 ENCOUNTER — Telehealth: Payer: Self-pay | Admitting: Family Medicine

## 2016-11-14 ENCOUNTER — Other Ambulatory Visit: Payer: Self-pay | Admitting: Family Medicine

## 2016-11-14 VITALS — BP 178/90

## 2016-11-14 DIAGNOSIS — I10 Essential (primary) hypertension: Secondary | ICD-10-CM

## 2016-11-14 MED ORDER — LOSARTAN POTASSIUM 100 MG PO TABS: 100.0000 mg | ORAL_TABLET | Freq: Every day | ORAL | 11 refills | Status: DC

## 2016-11-14 MED ORDER — LOSARTAN POTASSIUM-HCTZ 100-25 MG PO TABS: 1.0000 | ORAL_TABLET | Freq: Every day | ORAL | 3 refills | Status: DC

## 2016-11-14 NOTE — Telephone Encounter (Signed)
Changed bp med to Hyzaar and pt called back and states that he was on a diuretic before and it gave him horrible cramps all over and would like to know if there is something else he could take instead?

## 2016-11-14 NOTE — Telephone Encounter (Signed)
Pt came by to have BP checked - BP was 178/90. He states that he does not take it on a regular basis and not at the same time each day because if he works above his head it makes him dizzy and he wanted to know if he could take the same medication for 1 month taking it at night at the same time and come back in to have checked in a few weeks. I informed him that that should be ok and to keep up with his readings at home and when he comes in to have his BP checked in 2 weeks to bring those with him.   Cancelled Hyzaar rx and sent in losartan refill.

## 2016-11-15 NOTE — Telephone Encounter (Signed)
His blood pressure is very high and I don't think losartan alone will work but he can try it.  If no better in 2 weeks we will have to add something

## 2016-12-07 ENCOUNTER — Ambulatory Visit: Payer: 59

## 2016-12-07 VITALS — BP 140/70

## 2016-12-07 DIAGNOSIS — I10 Essential (primary) hypertension: Secondary | ICD-10-CM

## 2016-12-10 ENCOUNTER — Other Ambulatory Visit: Payer: Self-pay | Admitting: Family Medicine

## 2016-12-10 DIAGNOSIS — I10 Essential (primary) hypertension: Secondary | ICD-10-CM

## 2017-02-20 ENCOUNTER — Other Ambulatory Visit: Payer: Self-pay | Admitting: Family Medicine

## 2017-08-07 ENCOUNTER — Other Ambulatory Visit: Payer: Self-pay | Admitting: Family Medicine

## 2017-08-07 DIAGNOSIS — I10 Essential (primary) hypertension: Secondary | ICD-10-CM

## 2017-08-07 MED ORDER — ATENOLOL 50 MG PO TABS: 50.0000 mg | ORAL_TABLET | Freq: Every day | ORAL | 1 refills | Status: DC

## 2017-09-10 ENCOUNTER — Other Ambulatory Visit: Payer: Self-pay | Admitting: Family Medicine

## 2017-09-14 ENCOUNTER — Other Ambulatory Visit: Payer: Self-pay | Admitting: Family Medicine

## 2017-11-08 ENCOUNTER — Ambulatory Visit: Payer: 59 | Admitting: Family Medicine

## 2017-11-08 ENCOUNTER — Encounter: Payer: Self-pay | Admitting: Family Medicine

## 2017-11-08 VITALS — BP 180/82 | HR 70 | Temp 97.7°F | Resp 14 | Ht 67.0 in | Wt 171.0 lb

## 2017-11-08 DIAGNOSIS — Z125 Encounter for screening for malignant neoplasm of prostate: Secondary | ICD-10-CM | POA: Diagnosis not present

## 2017-11-08 DIAGNOSIS — B351 Tinea unguium: Secondary | ICD-10-CM

## 2017-11-08 DIAGNOSIS — M79671 Pain in right foot: Secondary | ICD-10-CM

## 2017-11-08 DIAGNOSIS — M79672 Pain in left foot: Secondary | ICD-10-CM

## 2017-11-08 DIAGNOSIS — I10 Essential (primary) hypertension: Secondary | ICD-10-CM | POA: Diagnosis not present

## 2017-11-08 DIAGNOSIS — E7800 Pure hypercholesterolemia, unspecified: Secondary | ICD-10-CM

## 2017-11-08 LAB — COMPLETE METABOLIC PANEL WITH GFR
AG Ratio: 2.1 (calc) (ref 1.0–2.5)
ALT: 19 U/L (ref 9–46)
AST: 21 U/L (ref 10–35)
Albumin: 4.2 g/dL (ref 3.6–5.1)
Alkaline phosphatase (APISO): 63 U/L (ref 40–115)
BUN: 21 mg/dL (ref 7–25)
CALCIUM: 9.1 mg/dL (ref 8.6–10.3)
CO2: 27 mmol/L (ref 20–32)
Chloride: 106 mmol/L (ref 98–110)
Creat: 1.15 mg/dL (ref 0.70–1.25)
GFR, Est African American: 79 mL/min/{1.73_m2} (ref >=60)
GFR, Est Non African American: 68 mL/min/{1.73_m2} (ref >=60)
GLUCOSE: 96 mg/dL (ref 65–99)
Globulin: 2 g/dL (calc) (ref 1.9–3.7)
Potassium: 4.4 mmol/L (ref 3.5–5.3)
Sodium: 139 mmol/L (ref 135–146)
Total Bilirubin: 1 mg/dL (ref 0.2–1.2)
Total Protein: 6.2 g/dL (ref 6.1–8.1)

## 2017-11-08 LAB — LIPID PANEL
CHOLESTEROL: 130 mg/dL (ref <200)
HDL: 45 mg/dL (ref >40)
LDL CHOLESTEROL (CALC): 71 mg/dL
Non-HDL Cholesterol (Calc): 85 mg/dL (calc) (ref <130)
Total CHOL/HDL Ratio: 2.9 (calc) (ref <5.0)
Triglycerides: 55 mg/dL (ref <150)

## 2017-11-08 LAB — PSA: PSA: 0.8 ng/mL (ref <=4.0)

## 2017-11-08 MED ORDER — MELOXICAM 15 MG PO TABS: 15.0000 mg | ORAL_TABLET | Freq: Every day | ORAL | 1 refills | Status: DC

## 2017-11-08 MED ORDER — TERBINAFINE HCL 250 MG PO TABS: 250.0000 mg | ORAL_TABLET | Freq: Every day | ORAL | 2 refills | Status: DC

## 2017-11-08 NOTE — Progress Notes (Signed)
Subjective:    Patient ID: Paul Hensley, male    DOB: 1955/12/26, 62 y.o.   MRN: [id number]  HPI  Patient has several concerns today.  He has bilateral foot pain distal to the MTP joints.  He describes it as pain and stiffness.  Usually first thing in the morning, he feels well.  The longer he is on his feet working, he develops more stiffness and pain in the MTP joints bilaterally.  Diclofenac merely mask it.  In fact he thinks ibuprofen works better.  He denies any claudication.  He denies any neuropathy in his feet.  He denies any burning or tingling.  Denies any numbness in the feet.  There is no erythema or warmth.  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  Second concern is his left great toenail.  His left great toenail is thick yellow and dysmorphic.  It also demonstrates Onikul lysis.  It is been that way for several months.  His blood pressure today is elevated 180/82.  However he checks his blood pressure every day at home and finds it to be less than 140/80 99% of the time.  He believes he has whitecoat syndrome and gets nervous in the doctor's office.  He is overdue to check his fasting lipid panel.  He denies any myalgias or right upper quadrant pain on lovastatin.  He is also due for prostate cancer screening.  Past Medical History:  Diagnosis Date  . GAD (generalized anxiety disorder)   . Hiatal hernia   . High triglycerides   . Hypertension    No past surgical history on file. Current Outpatient Medications on File Prior to Visit  Medication Sig Dispense Refill  . atenolol (TENORMIN) 50 MG tablet Take 1 tablet (50 mg total) by mouth daily. 90 tablet 1  . diclofenac (VOLTAREN) 75 MG EC tablet TAKE 1 TABLET BY MOUTH TWICE DAILY 60 tablet 0  . finasteride (PROSCAR) 5 MG tablet TAKE 1 TABLET BY MOUTH EVERY DAY 30 tablet 2  . fish oil-omega-3 fatty acids 1000 MG capsule Take 2 g by mouth daily. Reported on 11/24/2015    . losartan (COZAAR) 100 MG tablet TAKE  1 TABLET BY MOUTH EVERY DAY 30 tablet 9  . losartan-hydrochlorothiazide (HYZAAR) 100-25 MG tablet Take 1 tablet by mouth daily. 90 tablet 3  . lovastatin (MEVACOR) 20 MG tablet TAKE 1 TABLET BY MOUTH EVERYDAY AT BEDTIME 90 tablet 1   No current facility-administered medications on file prior to visit.    Allergies  Allergen Reactions  . Effexor [Venlafaxine Hydrochloride]    Social History   Socioeconomic History  . Marital status: Married    Spouse name: Not on file  . Number of children: Not on file  . Years of education: Not on file  . Highest education level: Not on file  Occupational History  . Not on file  Social Needs  . Financial resource strain: Not on file  . Food insecurity:    Worry: Not on file    Inability: Not on file  . Transportation needs:    Medical: Not on file    Non-medical: Not on file  Tobacco Use  . Smoking status: Former Smoker    Types: Cigarettes    Last attempt to quit: 10/02/1971    Years since quitting: 46.1  . Smokeless tobacco: Never Used  Substance and Sexual Activity  . Alcohol use: No  . Drug use: No  . Sexual  activity: Not on file  Lifestyle  . Physical activity:    Days per week: Not on file    Minutes per session: Not on file  . Stress: Not on file  Relationships  . Social connections:    Talks on phone: Not on file    Gets together: Not on file    Attends religious service: Not on file    Active member of club or organization: Not on file    Attends meetings of clubs or organizations: Not on file    Relationship status: Not on file  . Intimate partner violence:    Fear of current or ex partner: Not on file    Emotionally abused: Not on file    Physically abused: Not on file    Forced sexual activity: Not on file  Other Topics Concern  . Not on file  Social History Narrative  . Not on file     Review of Systems  All other systems reviewed and are negative.      Objective:   Physical Exam  Constitutional: He  appears well-developed and well-nourished. No distress.  Cardiovascular: Normal rate, regular rhythm and normal heart sounds. Exam reveals no gallop and no friction rub.  No murmur heard. Pulmonary/Chest: Effort normal and breath sounds normal. No stridor. No respiratory distress. He has no wheezes. He has no rales.  Abdominal: Soft. Bowel sounds are normal. He exhibits no distension and no mass. There is no tenderness. There is no guarding.  Musculoskeletal:       Right foot: There is normal range of motion, no tenderness, no bony tenderness, no swelling, no crepitus, no deformity and no laceration.       Left foot: There is normal range of motion, no tenderness, no bony tenderness, no swelling, no crepitus, no deformity and no laceration.  Skin: He is not diaphoretic.  Vitals reviewed.    Assessment & Plan:  Benign essential HTN - Plan: COMPLETE METABOLIC PANEL WITH GFR, Lipid panel, PSA, CBC with Differential/Platelet  Prostate cancer screening - Plan: PSA  Pure hypercholesterolemia  Onychomycosis  Pain in both feet  Treat the onychomycosis with Lamisil 250 mg p.o. daily for 3 months.  Recommended discontinuing statin medication while taking the Lamisil.  Also recommended that he check his liver function test every month while taking the medication to monitor for drug-induced hepatitis.  Screen the patient for prostate cancer with PSA.  I believe the metatarsalgia that the patient describes is due to overuse and arthritis.  I have recommended switching diclofenac to meloxicam 15 mg a day to see if he will tolerate this medication better.  Blood pressure today is extremely high but I believe is white coat syndrome.  I have asked the patient to check his blood pressure every day and notify us of the values in 1 week.  If persistently elevated, I would add amlodipine.  Patient cannot tolerate a diuretic due to cramping.  Check fasting lipid panel.  Goal LDL cholesterol is less than 130.   Temporarily discontinue statin while taking Lamisil

## 2017-11-09 LAB — CBC WITH DIFFERENTIAL/PLATELET
BASOS PCT: 0.4 %
Basophils Absolute: 22 cells/uL (ref 0–200)
EOS ABS: 130 {cells}/uL (ref 15–500)
Eosinophils Relative: 2.4 %
HCT: 45.4 % (ref 38.5–50.0)
Hemoglobin: 15.5 g/dL (ref 13.2–17.1)
LYMPHS ABS: 1436 {cells}/uL (ref 850–3900)
MCH: 29.6 pg (ref 27.0–33.0)
MCHC: 34.1 g/dL (ref 32.0–36.0)
MCV: 86.6 fL (ref 80.0–100.0)
MPV: 9.7 fL (ref 7.5–12.5)
Monocytes Relative: 7.4 %
Neutro Abs: 3413 cells/uL (ref 1500–7800)
Neutrophils Relative %: 63.2 %
Platelets: 271 10*3/uL (ref 140–400)
RBC: 5.24 10*6/uL (ref 4.20–5.80)
RDW: 12.5 % (ref 11.0–15.0)
TOTAL LYMPHOCYTE: 26.6 %
WBC mixed population: 400 cells/uL (ref 200–950)
WBC: 5.4 10*3/uL (ref 3.8–10.8)

## 2017-11-21 ENCOUNTER — Other Ambulatory Visit: Payer: Self-pay | Admitting: Family Medicine

## 2017-11-21 DIAGNOSIS — I10 Essential (primary) hypertension: Secondary | ICD-10-CM

## 2017-11-21 MED ORDER — ATENOLOL 50 MG PO TABS: 50.0000 mg | ORAL_TABLET | Freq: Every day | ORAL | 1 refills | Status: DC

## 2017-11-21 MED ORDER — LOSARTAN POTASSIUM 100 MG PO TABS: 100.0000 mg | ORAL_TABLET | Freq: Every day | ORAL | 1 refills | Status: DC

## 2017-12-02 ENCOUNTER — Other Ambulatory Visit: Payer: Self-pay | Admitting: Family Medicine

## 2017-12-09 ENCOUNTER — Other Ambulatory Visit: Payer: 59

## 2017-12-09 DIAGNOSIS — B351 Tinea unguium: Secondary | ICD-10-CM

## 2017-12-10 LAB — HEPATIC FUNCTION PANEL
AG Ratio: 2 (calc) (ref 1.0–2.5)
ALBUMIN MSPROF: 4.2 g/dL (ref 3.6–5.1)
ALT: 14 U/L (ref 9–46)
AST: 16 U/L (ref 10–35)
Alkaline phosphatase (APISO): 70 U/L (ref 40–115)
BILIRUBIN DIRECT: 0.2 mg/dL (ref 0.0–0.2)
BILIRUBIN TOTAL: 0.9 mg/dL (ref 0.2–1.2)
Globulin: 2.1 g/dL (calc) (ref 1.9–3.7)
Indirect Bilirubin: 0.7 mg/dL (calc) (ref 0.2–1.2)
Total Protein: 6.3 g/dL (ref 6.1–8.1)

## 2018-01-10 ENCOUNTER — Other Ambulatory Visit: Payer: 59

## 2018-01-10 DIAGNOSIS — B351 Tinea unguium: Secondary | ICD-10-CM

## 2018-01-10 LAB — HEPATIC FUNCTION PANEL
AG Ratio: 2.5 (calc) (ref 1.0–2.5)
ALBUMIN MSPROF: 4.2 g/dL (ref 3.6–5.1)
ALT: 16 U/L (ref 9–46)
AST: 16 U/L (ref 10–35)
Alkaline phosphatase (APISO): 79 U/L (ref 40–115)
BILIRUBIN TOTAL: 0.9 mg/dL (ref 0.2–1.2)
Bilirubin, Direct: 0.2 mg/dL (ref 0.0–0.2)
Globulin: 1.7 g/dL (calc) — ABNORMAL LOW (ref 1.9–3.7)
Indirect Bilirubin: 0.7 mg/dL (calc) (ref 0.2–1.2)
TOTAL PROTEIN: 5.9 g/dL — AB (ref 6.1–8.1)

## 2018-01-20 ENCOUNTER — Other Ambulatory Visit: Payer: Self-pay | Admitting: Family Medicine

## 2018-01-27 ENCOUNTER — Other Ambulatory Visit: Payer: Self-pay | Admitting: Family Medicine

## 2018-01-27 MED ORDER — ZOSTER VAC RECOMB ADJUVANTED 50 MCG/0.5ML IM SUSR: 0.5000 mL | Freq: Once | INTRAMUSCULAR | 1 refills | Status: AC

## 2018-01-29 DIAGNOSIS — Z23 Encounter for immunization: Secondary | ICD-10-CM | POA: Diagnosis not present

## 2018-01-30 ENCOUNTER — Other Ambulatory Visit: Payer: Self-pay | Admitting: Family Medicine

## 2018-04-28 ENCOUNTER — Ambulatory Visit: Payer: 59

## 2018-04-28 ENCOUNTER — Telehealth: Payer: Self-pay

## 2018-04-28 VITALS — BP 170/92 | HR 73

## 2018-04-28 DIAGNOSIS — I10 Essential (primary) hypertension: Secondary | ICD-10-CM

## 2018-04-28 NOTE — Telephone Encounter (Signed)
If that high, I would add hydrochlorothiazide 12.5 mg p.o. daily and recheck blood pressure in 2 weeks

## 2018-04-28 NOTE — Telephone Encounter (Signed)
Patient was in office for blood pressure check after getting high readings at home and at the pharmacy.  Patient blood pressure in office was 170/92 pulse 73 and O2 99  Sunday 10/20 2019  at home it was 177/77.  Patient denies having any chest pain, no headache, no numbness or tingling in arms. Please advise on what patient should do concerning his blood pressure. Patient states he has been taking his blood pressure medicine daily

## 2018-04-29 MED ORDER — HYDROCHLOROTHIAZIDE 12.5 MG PO TABS: 12.5000 mg | ORAL_TABLET | Freq: Every day | ORAL | 0 refills | Status: DC

## 2018-04-29 NOTE — Telephone Encounter (Signed)
Call placed to patient Paul Hensley with his wife Jola Babinski as well as patient cell phone with provider recommendations.Per Jola Babinski she asked me to send the rx to [facility] pharmacy in [place]

## 2018-05-08 ENCOUNTER — Other Ambulatory Visit: Payer: Self-pay | Admitting: Family Medicine

## 2018-05-08 MED ORDER — LOSARTAN POTASSIUM 100 MG PO TABS: 100.0000 mg | ORAL_TABLET | Freq: Every day | ORAL | 1 refills | Status: DC

## 2018-05-08 NOTE — Telephone Encounter (Signed)
Losartan sent to patient pharmacy

## 2018-05-08 NOTE — Telephone Encounter (Signed)
Refill losartan to [facility] [place]

## 2018-05-09 ENCOUNTER — Other Ambulatory Visit: Payer: Self-pay | Admitting: Family Medicine

## 2018-05-12 ENCOUNTER — Ambulatory Visit: Payer: 59

## 2018-05-12 DIAGNOSIS — I10 Essential (primary) hypertension: Secondary | ICD-10-CM

## 2018-05-12 NOTE — Progress Notes (Signed)
Patient was in office for blood pressure check,.Patient blood pressure is 126/60 pulse 77 Oxygen 98

## 2018-05-17 ENCOUNTER — Other Ambulatory Visit: Payer: Self-pay | Admitting: Family Medicine

## 2018-05-17 DIAGNOSIS — I10 Essential (primary) hypertension: Secondary | ICD-10-CM

## 2018-07-27 ENCOUNTER — Other Ambulatory Visit: Payer: Self-pay | Admitting: Family Medicine

## 2018-09-11 ENCOUNTER — Telehealth: Payer: Self-pay | Admitting: Family Medicine

## 2018-09-11 ENCOUNTER — Other Ambulatory Visit: Payer: Self-pay | Admitting: Family Medicine

## 2018-09-11 NOTE — Telephone Encounter (Signed)
Pt called [facility] stating that he has the yeast and would like [place] to call in diflucan

## 2018-09-11 NOTE — Telephone Encounter (Signed)
Need more information.

## 2018-09-12 NOTE — Telephone Encounter (Signed)
[facility]

## 2018-09-15 NOTE — Telephone Encounter (Signed)
[facility]

## 2018-10-30 ENCOUNTER — Other Ambulatory Visit: Payer: Self-pay | Admitting: Family Medicine

## 2018-11-21 ENCOUNTER — Ambulatory Visit: Payer: 59 | Admitting: Family Medicine

## 2018-11-21 ENCOUNTER — Other Ambulatory Visit: Payer: Self-pay

## 2018-11-21 ENCOUNTER — Encounter: Payer: Self-pay | Admitting: Family Medicine

## 2018-11-21 VITALS — BP 170/92 | HR 78 | Temp 97.8°F | Resp 18 | Ht 67.0 in | Wt 173.0 lb

## 2018-11-21 DIAGNOSIS — E7800 Pure hypercholesterolemia, unspecified: Secondary | ICD-10-CM | POA: Diagnosis not present

## 2018-11-21 DIAGNOSIS — I10 Essential (primary) hypertension: Secondary | ICD-10-CM | POA: Diagnosis not present

## 2018-11-21 DIAGNOSIS — Z125 Encounter for screening for malignant neoplasm of prostate: Secondary | ICD-10-CM | POA: Diagnosis not present

## 2018-11-21 MED ORDER — HYDROCHLOROTHIAZIDE 25 MG PO TABS: 25.0000 mg | ORAL_TABLET | Freq: Every day | ORAL | 3 refills | Status: DC

## 2018-11-21 NOTE — Progress Notes (Signed)
Subjective:    Patient ID: Paul Hensley, male    DOB: 02-05-1956, 63 y.o.   MRN: [id number]  HPI  11/2017 Patient has several concerns today.  He has bilateral foot pain distal to the MTP joints.  He describes it as pain and stiffness.  Usually first thing in the morning, he feels well.  The longer he is on his feet working, he develops more stiffness and pain in the MTP joints bilaterally.  Diclofenac merely mask it.  In fact he thinks ibuprofen works better.  He denies any claudication.  He denies any neuropathy in his feet.  He denies any burning or tingling.  Denies any numbness in the feet.  There is no erythema or warmth.  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  Second concern is his left great toenail.  His left great toenail is thick yellow and dysmorphic.  It also demonstrates Onikul lysis.  It is been that way for several months.  His blood pressure today is elevated 180/82.  However he checks his blood pressure every day at home and finds it to be less than 140/80 99% of the time.  He believes he has whitecoat syndrome and gets nervous in the doctor's office.  He is overdue to check his fasting lipid panel.  He denies any myalgias or right upper quadrant pain on lovastatin.  He is also due for prostate cancer screening.  At that time, my plan was:  Treat the onychomycosis with Lamisil 250 mg p.o. daily for 3 months.  Recommended discontinuing statin medication while taking the Lamisil.  Also recommended that he check his liver function test every month while taking the medication to monitor for drug-induced hepatitis.  Screen the patient for prostate cancer with PSA.  I believe the metatarsalgia that the patient describes is due to overuse and arthritis.  I have recommended switching diclofenac to meloxicam 15 mg a day to see if he will tolerate this medication better.  Blood pressure today is extremely high but I believe is white coat syndrome.  I have asked the patient  to check his blood pressure every day and notify us of the values in 1 week.  If persistently elevated, I would add amlodipine.  Patient cannot tolerate a diuretic due to cramping.  Check fasting lipid panel.  Goal LDL cholesterol is less than 130.  Temporarily discontinue statin while taking Lamisil  11/21/18 Patient's blood pressure here today is elevated at 170/92.  He denies any chest pain shortness of breath or dyspnea on exertion.  He is taking his medication regularly including his hydrochlorothiazide, losartan, and atenolol.  He denies any dizziness.  However he is checking his blood pressure at home and his systolic blood pressure at home is usually 1 45-1 50.  He is also due today to recheck a fasting lipid panel for his dyslipidemia and hyperlipidemia.  He denies any myalgias or right upper quadrant pain on his lovastatin.  He is also taking fish oil.  He does continue to have joint pains in his feet which he takes meloxicam for occasionally during the week and seems to help with the pain in his feet.  He is also using Tylenol occasionally for pain in his back.   Past Medical History:  Diagnosis Date  . GAD (generalized anxiety disorder)   . Hiatal hernia   . High triglycerides   . Hypertension    No past surgical history on file. Current Outpatient  Medications on File Prior to Visit  Medication Sig Dispense Refill  . atenolol (TENORMIN) 50 MG tablet TAKE 1 TABLET BY MOUTH ONCE DAILY 90 tablet 1  . finasteride (PROSCAR) 5 MG tablet TAKE 1 TABLET BY MOUTH DAILY 90 tablet 3  . finasteride (PROSCAR) 5 MG tablet TAKE ONE TABLET BY MOUTH ONCE DAILY 30 tablet 11  . fish oil-omega-3 fatty acids 1000 MG capsule Take 2 g by mouth daily. Reported on 11/24/2015    . hydrochlorothiazide (HYDRODIURIL) 12.5 MG tablet TAKE 1 TABLET BY MOUTH ONCE DAILY 90 tablet 2  . losartan (COZAAR) 100 MG tablet Take 1 tablet by mouth once daily 90 tablet 2  . lovastatin (MEVACOR) 20 MG tablet TAKE 1 TABLET BY  MOUTH ONCE DAILY AT BEDTIME 90 tablet 2  . meloxicam (MOBIC) 15 MG tablet TAKE 1 TABLET BY MOUTH ONCE DAILY 90 tablet 2  . terbinafine (LAMISIL) 250 MG tablet Take 1 tablet (250 mg total) by mouth daily. 30 tablet 2   No current facility-administered medications on file prior to visit.    Allergies  Allergen Reactions  . Effexor [Venlafaxine Hydrochloride]    Social History   Socioeconomic History  . Marital status: Married    Spouse name: Not on file  . Number of children: Not on file  . Years of education: Not on file  . Highest education level: Not on file  Occupational History  . Not on file  Social Needs  . Financial resource strain: Not on file  . Food insecurity:    Worry: Not on file    Inability: Not on file  . Transportation needs:    Medical: Not on file    Non-medical: Not on file  Tobacco Use  . Smoking status: Former Smoker    Types: Cigarettes    Last attempt to quit: 10/02/1971    Years since quitting: 47.1  . Smokeless tobacco: Never Used  Substance and Sexual Activity  . Alcohol use: No  . Drug use: No  . Sexual activity: Not on file  Lifestyle  . Physical activity:    Days per week: Not on file    Minutes per session: Not on file  . Stress: Not on file  Relationships  . Social connections:    Talks on phone: Not on file    Gets together: Not on file    Attends religious service: Not on file    Active member of club or organization: Not on file    Attends meetings of clubs or organizations: Not on file    Relationship status: Not on file  . Intimate partner violence:    Fear of current or ex partner: Not on file    Emotionally abused: Not on file    Physically abused: Not on file    Forced sexual activity: Not on file  Other Topics Concern  . Not on file  Social History Narrative  . Not on file     Review of Systems  All other systems reviewed and are negative.      Objective:   Physical Exam Vitals signs reviewed.   Constitutional:      General: He is not in acute distress.    Appearance: He is well-developed. He is not diaphoretic.  Cardiovascular:     Rate and Rhythm: Normal rate and regular rhythm.     Heart sounds: Normal heart sounds. No murmur. No friction rub. No gallop.   Pulmonary:     Effort: Pulmonary effort  is normal. No respiratory distress.     Breath sounds: Normal breath sounds. No stridor. No wheezing or rales.  Abdominal:     General: Bowel sounds are normal. There is no distension.     Palpations: Abdomen is soft. There is no mass.     Tenderness: There is no abdominal tenderness. There is no guarding.  Musculoskeletal:     Right foot: Normal range of motion. No tenderness, bony tenderness, swelling, crepitus, deformity or laceration.     Left foot: Normal range of motion. No tenderness, bony tenderness, swelling, crepitus, deformity or laceration.      Assessment & Plan:  Benign essential HTN - Plan: CBC with Differential/Platelet, COMPLETE METABOLIC PANEL WITH GFR, Lipid panel  Prostate cancer screening - Plan: PSA  Pure hypercholesterolemia - Plan: CBC with Differential/Platelet, COMPLETE METABOLIC PANEL WITH GFR, Lipid panel Blood pressure is elevated today.  Increase hydrochlorothiazide to 25 mg a day and recheck blood pressure in 2 weeks.  Check a CBC, CMP, and fasting lipid panel.  Ideally I like his LDL cholesterol to be below 100 and his triglycerides to be less than 150.  Monitor his renal function given his elevated blood pressure and chronic use of NSAIDs.  Screen the patient for prostate cancer with a PSA.

## 2018-11-22 LAB — LIPID PANEL
Cholesterol: 150 mg/dL (ref <200)
HDL: 43 mg/dL (ref >=40)
LDL Cholesterol (Calc): 87 mg/dL (calc)
Non-HDL Cholesterol (Calc): 107 mg/dL (calc) (ref <130)
Total CHOL/HDL Ratio: 3.5 (calc) (ref <5.0)
Triglycerides: 103 mg/dL (ref <150)

## 2018-11-22 LAB — COMPLETE METABOLIC PANEL WITH GFR
AG Ratio: 2.1 (calc) (ref 1.0–2.5)
ALT: 23 U/L (ref 9–46)
AST: 20 U/L (ref 10–35)
Albumin: 4.5 g/dL (ref 3.6–5.1)
Alkaline phosphatase (APISO): 75 U/L (ref 35–144)
BUN: 12 mg/dL (ref 7–25)
CO2: 26 mmol/L (ref 20–32)
Calcium: 9.9 mg/dL (ref 8.6–10.3)
Chloride: 97 mmol/L — ABNORMAL LOW (ref 98–110)
Creat: 1.11 mg/dL (ref 0.70–1.25)
GFR, Est African American: 81 mL/min/{1.73_m2} (ref >=60)
GFR, Est Non African American: 70 mL/min/{1.73_m2} (ref >=60)
Globulin: 2.1 g/dL (calc) (ref 1.9–3.7)
Glucose, Bld: 93 mg/dL (ref 65–99)
Potassium: 4.5 mmol/L (ref 3.5–5.3)
Sodium: 134 mmol/L — ABNORMAL LOW (ref 135–146)
Total Bilirubin: 1.4 mg/dL — ABNORMAL HIGH (ref 0.2–1.2)
Total Protein: 6.6 g/dL (ref 6.1–8.1)

## 2018-11-22 LAB — PSA: PSA: 0.6 ng/mL (ref <=4.0)

## 2018-11-22 LAB — CBC WITH DIFFERENTIAL/PLATELET
Absolute Monocytes: 540 cells/uL (ref 200–950)
Basophils Absolute: 43 cells/uL (ref 0–200)
Basophils Relative: 0.6 %
Eosinophils Absolute: 128 cells/uL (ref 15–500)
Eosinophils Relative: 1.8 %
HCT: 48 % (ref 38.5–50.0)
Hemoglobin: 16.8 g/dL (ref 13.2–17.1)
Lymphs Abs: 1605 cells/uL (ref 850–3900)
MCH: 30.8 pg (ref 27.0–33.0)
MCHC: 35 g/dL (ref 32.0–36.0)
MCV: 87.9 fL (ref 80.0–100.0)
MPV: 9.3 fL (ref 7.5–12.5)
Monocytes Relative: 7.6 %
Neutro Abs: 4785 cells/uL (ref 1500–7800)
Neutrophils Relative %: 67.4 %
Platelets: 330 10*3/uL (ref 140–400)
RBC: 5.46 10*6/uL (ref 4.20–5.80)
RDW: 12.9 % (ref 11.0–15.0)
Total Lymphocyte: 22.6 %
WBC: 7.1 10*3/uL (ref 3.8–10.8)

## 2018-12-03 ENCOUNTER — Other Ambulatory Visit: Payer: Self-pay | Admitting: Family Medicine

## 2018-12-03 DIAGNOSIS — I10 Essential (primary) hypertension: Secondary | ICD-10-CM

## 2018-12-04 ENCOUNTER — Ambulatory Visit (INDEPENDENT_AMBULATORY_CARE_PROVIDER_SITE_OTHER): Payer: 59 | Admitting: Family Medicine

## 2018-12-04 ENCOUNTER — Other Ambulatory Visit: Payer: Self-pay

## 2018-12-04 VITALS — BP 164/70

## 2018-12-04 DIAGNOSIS — I10 Essential (primary) hypertension: Secondary | ICD-10-CM

## 2018-12-04 NOTE — Progress Notes (Signed)
Pt came in for a bp check. L arm bp was 164/70. I advised pt to check it at home and send [place] the readings. Add cardura 2 mg poqd and recheck in 2 weeks

## 2018-12-08 ENCOUNTER — Telehealth: Payer: Self-pay | Admitting: Family Medicine

## 2018-12-08 NOTE — Telephone Encounter (Signed)
-----   Message from Donita Brooks, MD sent at 12/04/2018  3:25 PM EDT ----- BP still too high.  Begin Cardura 2 mg a day and recheck blood pressure in 2 weeks

## 2018-12-08 NOTE — Telephone Encounter (Signed)
[facility]

## 2018-12-11 MED ORDER — DOXAZOSIN MESYLATE 2 MG PO TABS: 2.0000 mg | ORAL_TABLET | Freq: Every day | ORAL | 1 refills | Status: DC

## 2018-12-11 NOTE — Telephone Encounter (Signed)
LMTRC on both home and cell number 

## 2018-12-11 NOTE — Telephone Encounter (Signed)
Pt aware and med sent to pharm 

## 2019-01-01 ENCOUNTER — Ambulatory Visit: Payer: 59 | Admitting: Podiatry

## 2019-01-29 ENCOUNTER — Encounter: Payer: Self-pay | Admitting: Podiatry

## 2019-01-29 ENCOUNTER — Ambulatory Visit: Payer: 59 | Admitting: Podiatry

## 2019-01-29 ENCOUNTER — Other Ambulatory Visit: Payer: Self-pay

## 2019-01-29 ENCOUNTER — Ambulatory Visit (INDEPENDENT_AMBULATORY_CARE_PROVIDER_SITE_OTHER): Payer: 59

## 2019-01-29 ENCOUNTER — Other Ambulatory Visit: Payer: Self-pay | Admitting: Podiatry

## 2019-01-29 VITALS — Temp 97.2°F

## 2019-01-29 DIAGNOSIS — M779 Enthesopathy, unspecified: Secondary | ICD-10-CM

## 2019-01-29 DIAGNOSIS — M778 Other enthesopathies, not elsewhere classified: Secondary | ICD-10-CM

## 2019-01-29 DIAGNOSIS — M7750 Other enthesopathy of unspecified foot: Secondary | ICD-10-CM

## 2019-01-29 MED ORDER — CELECOXIB 200 MG PO CAPS: 200.0000 mg | ORAL_CAPSULE | Freq: Two times a day (BID) | ORAL | 2 refills | Status: DC

## 2019-01-29 NOTE — Progress Notes (Signed)
  Subjective:  Patient ID: Paul Hensley, male    DOB: 02-06-1956,  MRN: [id number] HPI Chief Complaint  Patient presents with  . Foot Pain    Plantar forefoot bilateral - stiffness x years intermittently, sometimes pain in plantar heel, crawls and climbs at work, tried OTC inserts -helped some - taking meloxicam - no help  . New Patient (Initial Visit)    63 y.o. male presents with the above complaint.   ROS: He denies fever chills nausea vomiting muscle aches pains calf pain back pain chest pain shortness of breath.  Past Medical History:  Diagnosis Date  . GAD (generalized anxiety disorder)   . Hiatal hernia   . High triglycerides   . Hypertension    No past surgical history on file.  Current Outpatient Medications:  .  atenolol (TENORMIN) 50 MG tablet, Take 1 tablet by mouth once daily, Disp: 90 tablet, Rfl: 2 .  celecoxib (CELEBREX) 200 MG capsule, Take 1 capsule (200 mg total) by mouth 2 (two) times daily., Disp: 60 capsule, Rfl: 2 .  doxazosin (CARDURA) 2 MG tablet, Take 1 tablet (2 mg total) by mouth daily., Disp: 90 tablet, Rfl: 1 .  finasteride (PROSCAR) 5 MG tablet, TAKE 1 TABLET BY MOUTH DAILY, Disp: 90 tablet, Rfl: 3 .  fish oil-omega-3 fatty acids 1000 MG capsule, Take 2 g by mouth daily. Reported on 11/24/2015, Disp: , Rfl:  .  hydrochlorothiazide (HYDRODIURIL) 25 MG tablet, Take 1 tablet (25 mg total) by mouth daily., Disp: 90 tablet, Rfl: 3 .  losartan (COZAAR) 100 MG tablet, Take 1 tablet by mouth once daily, Disp: 90 tablet, Rfl: 2 .  lovastatin (MEVACOR) 20 MG tablet, TAKE 1 TABLET BY MOUTH ONCE DAILY AT BEDTIME, Disp: 90 tablet, Rfl: 2 .  meloxicam (MOBIC) 15 MG tablet, TAKE 1 TABLET BY MOUTH ONCE DAILY, Disp: 90 tablet, Rfl: 2  Allergies  Allergen Reactions  . Effexor [Venlafaxine Hydrochloride]    Review of Systems Objective:   Vitals:   01/29/19 0939  Temp: (!) 97.2 F (36.2 C)    General: Well developed, nourished, in no acute distress, alert and  oriented x3   Dermatological: Skin is warm, dry and supple bilateral. Nails x 10 are well maintained; remaining integument appears unremarkable at this time. There are no open sores, no preulcerative lesions, no rash or signs of infection present.  Vascular: Dorsalis Pedis artery and Posterior Tibial artery pedal pulses are 2/4 bilateral with immedate capillary fill time. Pedal hair growth present. No varicosities and no lower extremity edema present bilateral.   Neruologic: Grossly intact via light touch bilateral. Vibratory intact via tuning fork bilateral. Protective threshold with Semmes Wienstein monofilament intact to all pedal sites bilateral. Patellar and Achilles deep tendon reflexes 2+ bilateral. No Babinski or clonus noted bilateral.   Musculoskeletal: No gross boney pedal deformities bilateral. No pain, crepitus, or limitation noted with foot and ankle range of motion bilateral. Muscular strength 5/5 in all groups tested bilateral.  Gait: Unassisted, Nonantalgic.    Radiographs:  No acute findings on radiographic evaluation  Assessment & Plan:   Assessment: Capsulitis of the forefoot bilaterally  Plan: Instructed him to wear new boots or new shoes and to wear the orthotics that he has.     Bibi Economos T. [place], [place]

## 2019-03-11 ENCOUNTER — Other Ambulatory Visit: Payer: Self-pay | Admitting: Occupational Medicine

## 2019-03-11 ENCOUNTER — Ambulatory Visit: Payer: Self-pay

## 2019-03-11 ENCOUNTER — Other Ambulatory Visit: Payer: Self-pay

## 2019-03-11 DIAGNOSIS — M25562 Pain in left knee: Secondary | ICD-10-CM

## 2019-05-07 ENCOUNTER — Other Ambulatory Visit: Payer: Self-pay | Admitting: Family Medicine

## 2019-06-13 ENCOUNTER — Other Ambulatory Visit: Payer: Self-pay | Admitting: Family Medicine

## 2019-06-27 ENCOUNTER — Other Ambulatory Visit: Payer: Self-pay | Admitting: Family Medicine

## 2019-08-02 ENCOUNTER — Other Ambulatory Visit: Payer: Self-pay | Admitting: Family Medicine

## 2019-08-15 ENCOUNTER — Other Ambulatory Visit: Payer: Self-pay | Admitting: Family Medicine

## 2019-09-05 ENCOUNTER — Other Ambulatory Visit: Payer: Self-pay | Admitting: Family Medicine

## 2019-09-05 DIAGNOSIS — I10 Essential (primary) hypertension: Secondary | ICD-10-CM

## 2019-10-31 ENCOUNTER — Other Ambulatory Visit: Payer: Self-pay | Admitting: Family Medicine

## 2019-11-03 ENCOUNTER — Telehealth: Payer: Self-pay | Admitting: Family Medicine

## 2019-11-04 NOTE — Telephone Encounter (Signed)
Error

## 2019-11-24 ENCOUNTER — Ambulatory Visit: Payer: 59 | Admitting: Family Medicine

## 2019-11-29 ENCOUNTER — Other Ambulatory Visit: Payer: Self-pay | Admitting: Family Medicine

## 2019-11-29 DIAGNOSIS — I10 Essential (primary) hypertension: Secondary | ICD-10-CM

## 2020-01-03 ENCOUNTER — Other Ambulatory Visit: Payer: Self-pay | Admitting: Family Medicine

## 2020-01-04 ENCOUNTER — Other Ambulatory Visit: Payer: Self-pay

## 2020-01-04 MED ORDER — MELOXICAM 15 MG PO TABS: 15.0000 mg | ORAL_TABLET | Freq: Every day | ORAL | 3 refills | Status: DC

## 2020-01-30 ENCOUNTER — Other Ambulatory Visit: Payer: Self-pay | Admitting: Family Medicine

## 2020-02-01 NOTE — Telephone Encounter (Signed)
Last OV 12/04/18 Last refill 11/02/19

## 2020-02-01 NOTE — Telephone Encounter (Signed)
Ok to refill, but he needs to make appt.

## 2020-02-02 IMAGING — DX DG KNEE COMPLETE 4+V*L*
4 series · 4 of 4 positions shown · non-contrast
Comparison: None.

CLINICAL DATA: Acute LEFT knee pain following injury. Initial
encounter.

EXAM:
LEFT KNEE - COMPLETE 4+ VIEW

[knee pa]
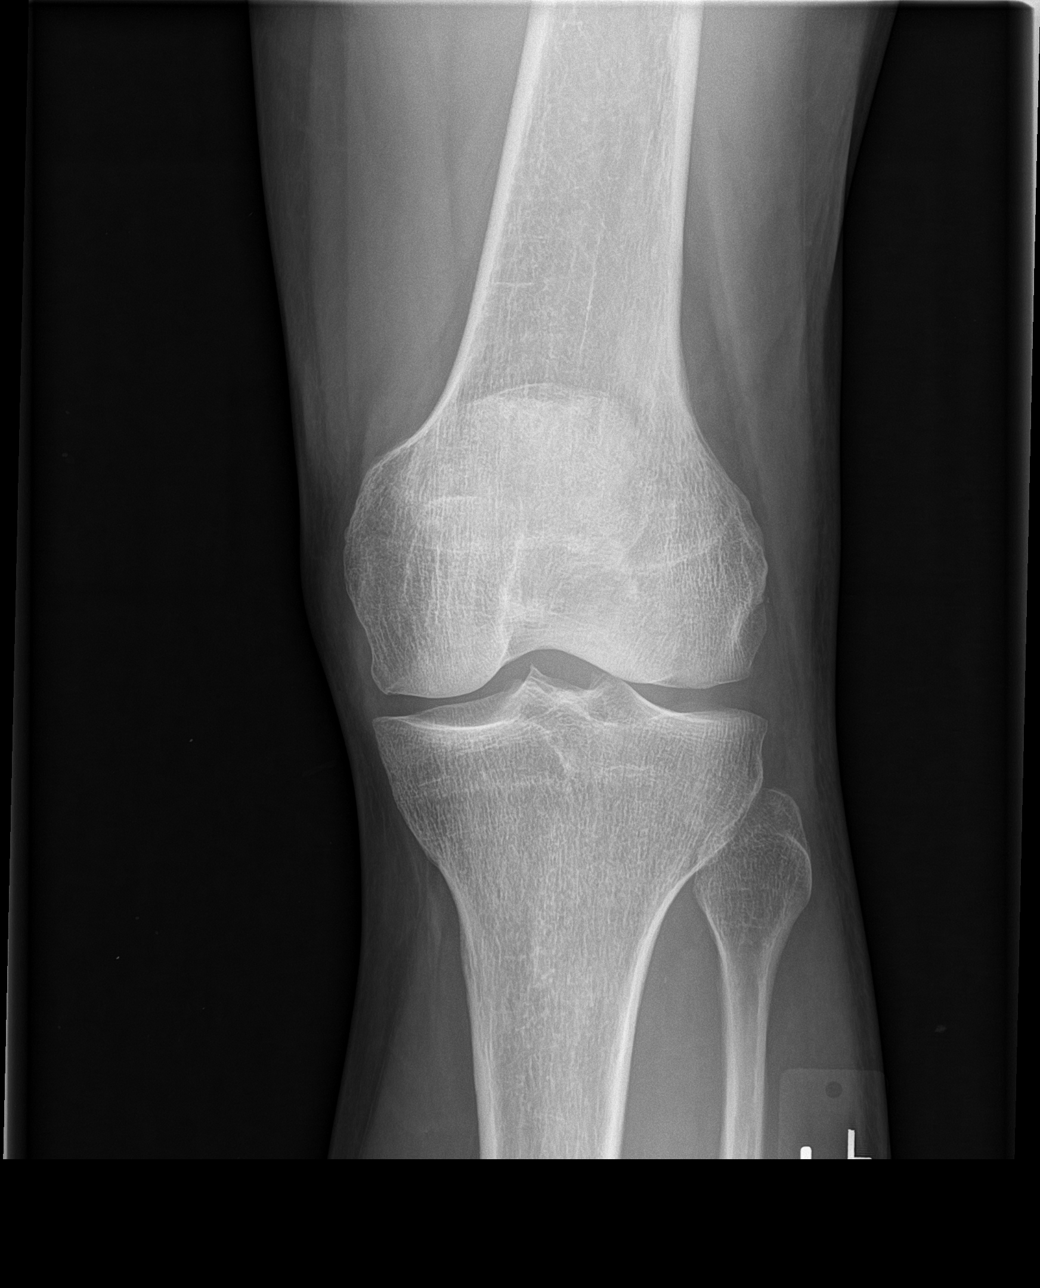

[knee obl (1 of 2)]
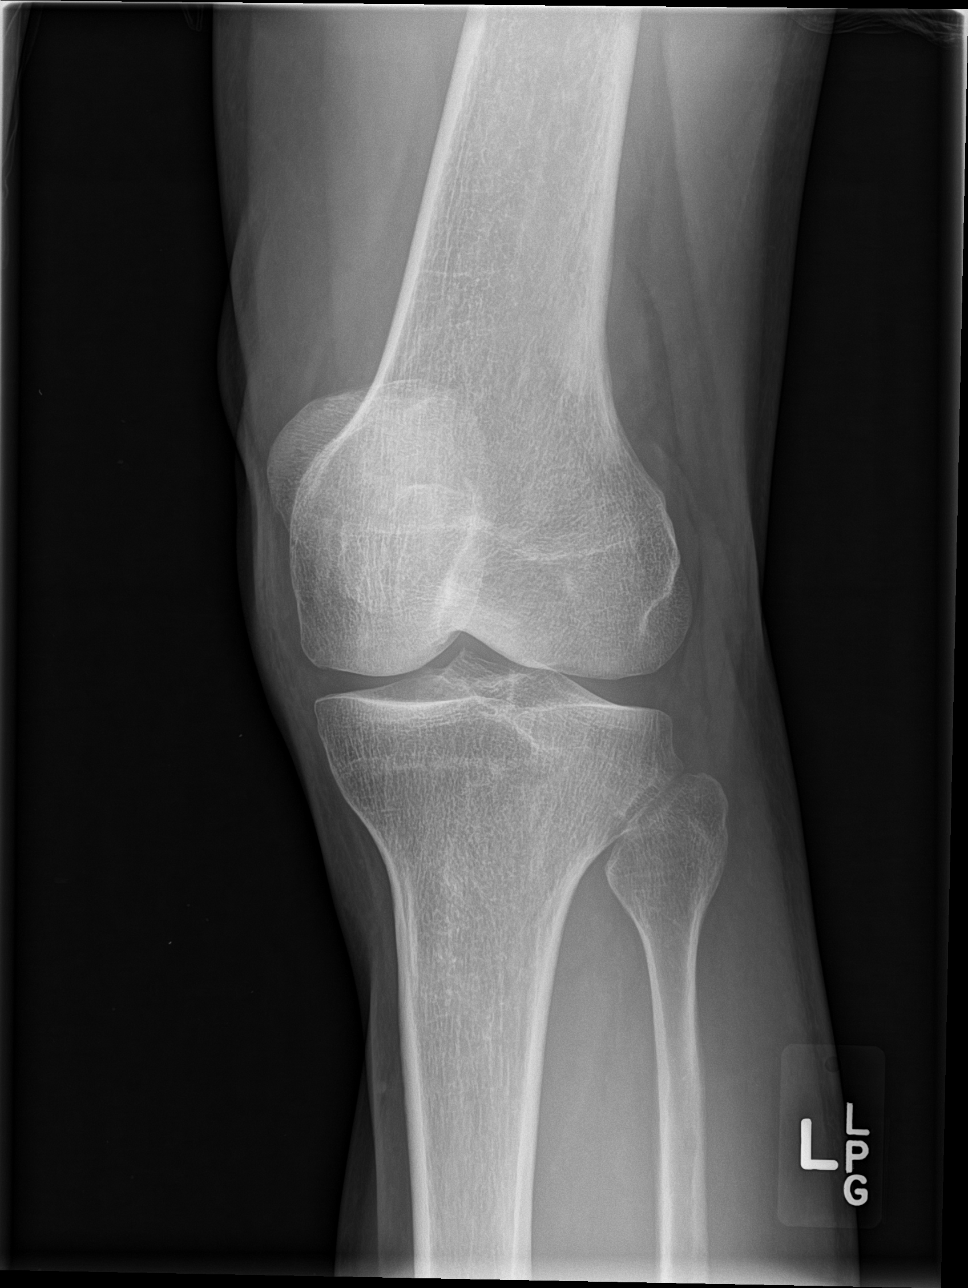

[knee obl (2 of 2)]
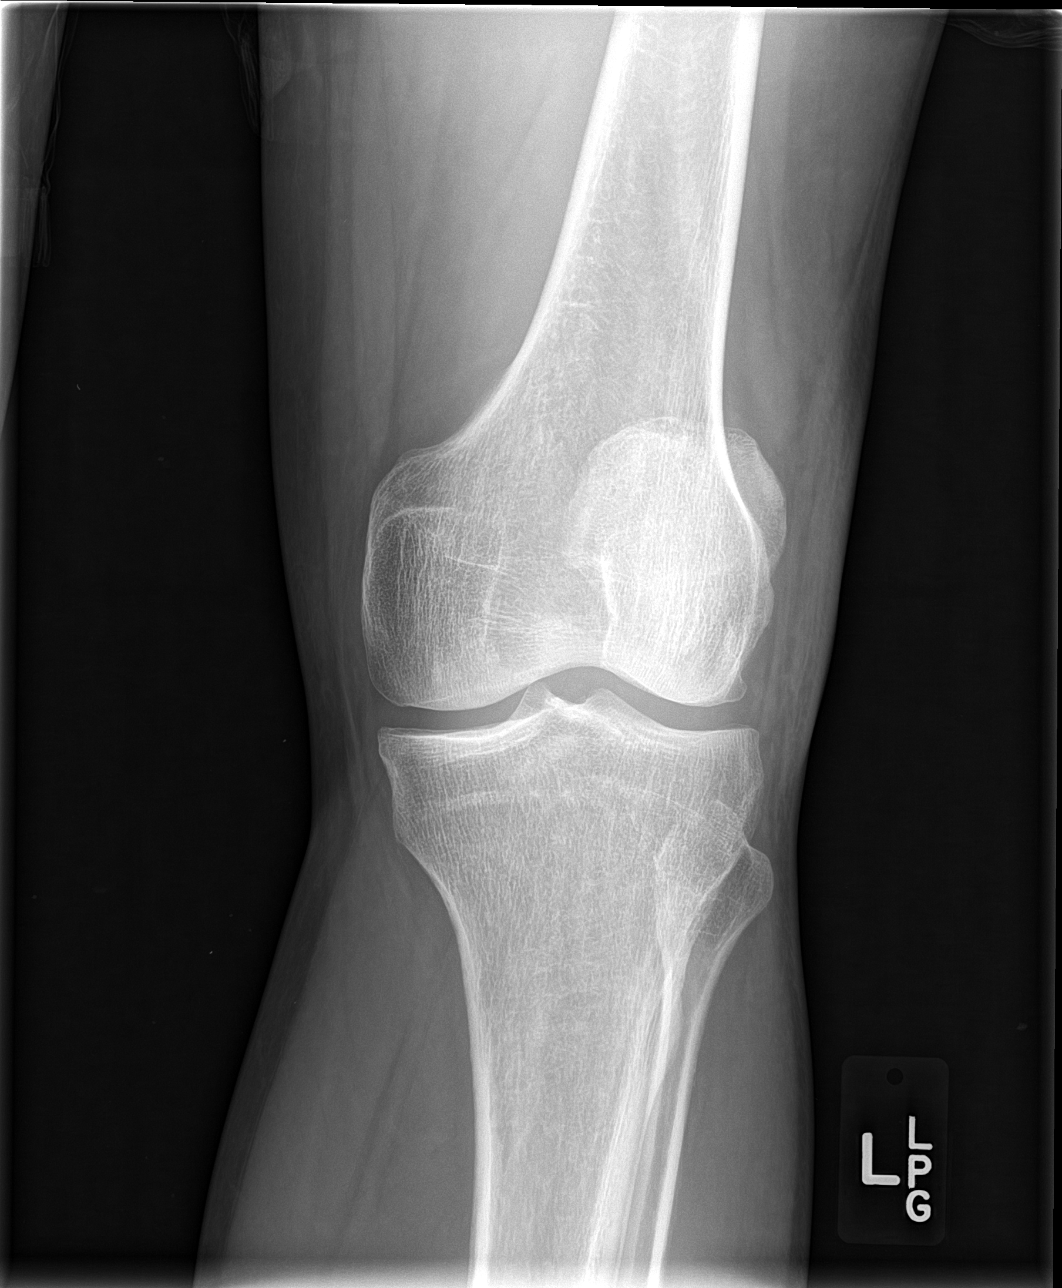

[knee lat]
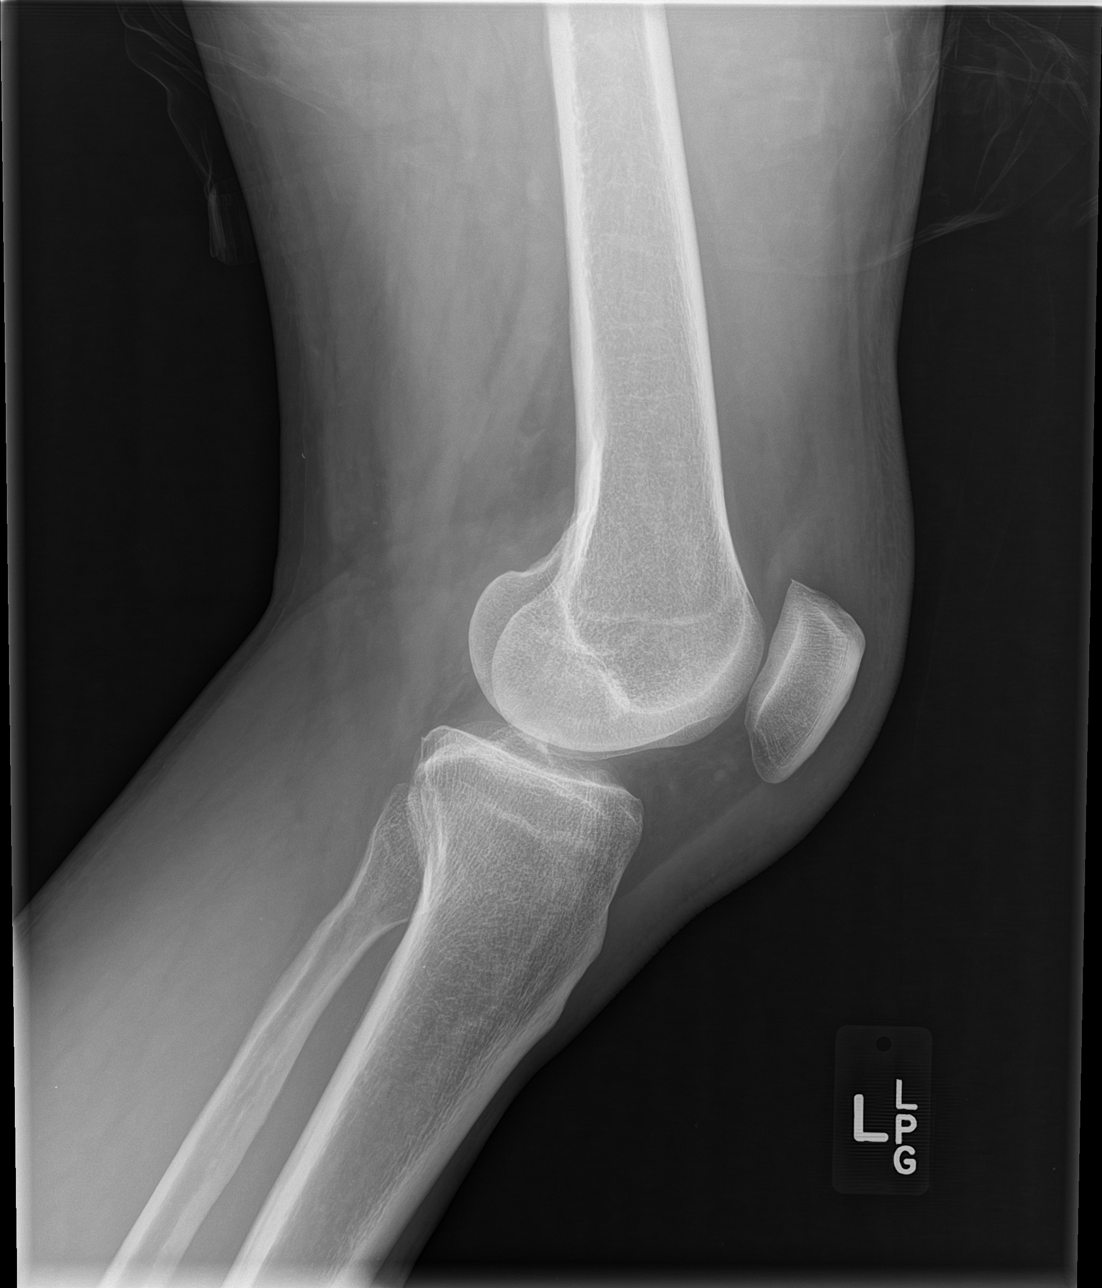

[4 of 4 positions shown; findings below may reference images not displayed]

FINDINGS: There is no evidence of acute fracture, subluxation or dislocation.

Anterior soft tissue swelling is noted.

A trace joint effusion is noted.

No focal bony lesions are present.
IMPRESSION: Soft tissue swelling and trace knee effusion. No acute bony
abnormalities identified.

## 2020-02-17 ENCOUNTER — Other Ambulatory Visit: Payer: Self-pay

## 2020-03-02 ENCOUNTER — Other Ambulatory Visit: Payer: Self-pay

## 2020-03-02 DIAGNOSIS — I10 Essential (primary) hypertension: Secondary | ICD-10-CM

## 2020-03-02 MED ORDER — LOSARTAN POTASSIUM 100 MG PO TABS
100.0000 mg | ORAL_TABLET | Freq: Every day | ORAL | 0 refills | Status: DC
Start: 2020-03-02 — End: 2020-04-11

## 2020-03-02 MED ORDER — ATENOLOL 50 MG PO TABS: 50.0000 mg | ORAL_TABLET | Freq: Every day | ORAL | 0 refills | Status: DC

## 2020-04-08 ENCOUNTER — Other Ambulatory Visit: Payer: Self-pay | Admitting: Family Medicine

## 2020-04-08 ENCOUNTER — Ambulatory Visit (INDEPENDENT_AMBULATORY_CARE_PROVIDER_SITE_OTHER): Payer: 59 | Admitting: Family Medicine

## 2020-04-08 ENCOUNTER — Other Ambulatory Visit: Payer: Self-pay

## 2020-04-08 VITALS — BP 160/70 | HR 71 | Temp 97.7°F | Ht 66.0 in | Wt 178.0 lb

## 2020-04-08 DIAGNOSIS — Z125 Encounter for screening for malignant neoplasm of prostate: Secondary | ICD-10-CM | POA: Diagnosis not present

## 2020-04-08 DIAGNOSIS — E7800 Pure hypercholesterolemia, unspecified: Secondary | ICD-10-CM | POA: Diagnosis not present

## 2020-04-08 DIAGNOSIS — I10 Essential (primary) hypertension: Secondary | ICD-10-CM

## 2020-04-08 MED ORDER — DOXAZOSIN MESYLATE 4 MG PO TABS
4.0000 mg | ORAL_TABLET | Freq: Every day | ORAL | 5 refills | Status: DC
Start: 2020-04-08 — End: 2020-10-10

## 2020-04-08 NOTE — Progress Notes (Signed)
Subjective:    Patient ID: Paul Hensley, male    DOB: [birth year], 64 y.o.   MRN: [id number]  HPI  Patient is a very pleasant 64 year old Caucasian male here today for a checkup.  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  As result his blood pressure today is elevated.  He cannot tolerate amlodipine due to leg swelling however he is only on a low-dose of doxazosin and would be willing to try to increase that.  He is due for a flu shot however he just recently had his first of 2 Covid vaccines.  He is due to get the second 1 in approximately 1 week and therefore we have elected to hold off on his flu shot until after this.  He is due for prostate cancer screening.  He denies any chest pain shortness of breath or dyspnea on exertion.  He does report a fungal-like rash in the superior aspect of the gluteal cleft and at the corners of his mouth that responds to antifungal cream. Past Medical History:  Diagnosis Date  . GAD (generalized anxiety disorder)   . Hiatal hernia   . High triglycerides   . Hypertension    No past surgical history on file. Current Outpatient Medications on File Prior to Visit  Medication Sig Dispense Refill  . meloxicam (MOBIC) 15 MG tablet Take 1 tablet by mouth once daily 30 tablet 6  . atenolol (TENORMIN) 50 MG tablet Take 1 tablet (50 mg total) by mouth daily. 30 tablet 0  . celecoxib (CELEBREX) 200 MG capsule Take 1 capsule (200 mg total) by mouth 2 (two) times daily. 60 capsule 2  . doxazosin (CARDURA) 2 MG tablet Take 1 tablet by mouth once daily 90 tablet 3  . finasteride (PROSCAR) 5 MG tablet Take 1 tablet by mouth once daily 30 tablet 6  . fish oil-omega-3 fatty acids 1000 MG capsule Take 2 g by mouth daily. Reported on 11/24/2015    . hydrochlorothiazide  (HYDRODIURIL) 25 MG tablet Take 1 tablet (25 mg total) by mouth daily. 90 tablet 3  . losartan (COZAAR) 100 MG tablet Take 1 tablet (100 mg total) by mouth daily. 30 tablet 0  . lovastatin (MEVACOR) 20 MG tablet TAKE 1 TABLET BY MOUTH ONCE DAILY AT BEDTIME 90 tablet 1   No current facility-administered medications on file prior to visit.   Allergies  Allergen Reactions  . Effexor [Venlafaxine Hydrochloride]    Social History   Socioeconomic History  . Marital status: Married    Spouse name: Not on file  . Number of children: Not on file  . Years of education: Not on file  . Highest education level: Not on file  Occupational History  . Not on file  Tobacco Use  . Smoking status: Former Smoker    Types: Cigarettes    Quit date: 10/02/1971    Years since quitting: 48.5  . Smokeless tobacco: Never Used  Substance and Sexual Activity  . Alcohol use: No  . Drug use: No  . Sexual activity: Not on file  Other Topics Concern  . Not on file  Social History Narrative  . Not on file   Social Determinants of Health   Financial Resource Strain:   . Difficulty of Paying Living Expenses: Not on file  Food Insecurity:   . Worried About Programme researcher, broadcasting/film/video in the Last Year: Not on file  . Ran Out of Food in the Last Year: Not on file  Transportation Needs:   . Lack of Transportation (Medical): Not on file  . Lack of Transportation (Non-Medical): Not on file  Physical Activity:   . Days of Exercise per Week: Not on file  . Minutes of Exercise per Session: Not on file  Stress:   . Feeling of Stress : Not on file  Social Connections:   . Frequency of Communication with Friends and Family: Not on file  . Frequency of Social Gatherings with Friends and Family: Not on file  . Attends Religious Services: Not on file  . Active Member of Clubs or Organizations: Not on file  . Attends Banker Meetings: Not on file  . Marital Status: Not on file  Intimate Partner Violence:    . Fear of Current or Ex-Partner: Not on file  . Emotionally Abused: Not on file  . Physically Abused: Not on file  . Sexually Abused: Not on file     Review of Systems  All other systems reviewed and are negative.      Objective:   Physical Exam Vitals reviewed.  Constitutional:      General: He is not in acute distress.    Appearance: He is well-developed. He is not diaphoretic.  Cardiovascular:     Rate and Rhythm: Normal rate and regular rhythm.     Heart sounds: Normal heart sounds. No murmur heard.  No friction rub. No gallop.   Pulmonary:     Effort: Pulmonary effort is normal. No respiratory distress.     Breath sounds: Normal breath sounds. No stridor. No wheezing or rales.  Abdominal:     General: Bowel sounds are normal. There is no distension.     Palpations: Abdomen is soft. There is no mass.     Tenderness: There is no abdominal tenderness. There is no guarding.  Musculoskeletal:     Right foot: Normal range of motion. No swelling, deformity, laceration, tenderness, bony tenderness or crepitus.     Left foot: Normal range of motion. No swelling, deformity, laceration, tenderness, bony tenderness or crepitus.      Assessment & Plan:  Benign essential HTN - Plan: CBC with Differential/Platelet, COMPLETE METABOLIC PANEL WITH GFR, Lipid panel  Pure hypercholesterolemia - Plan: CBC with Differential/Platelet, COMPLETE METABOLIC PANEL WITH GFR, Lipid panel  Prostate cancer screening - Plan: PSA  Blood pressure today is elevated.  Discontinue hydrochlorothiazide as the patient has not been taking that.  Increase doxazosin to 4 mg a day and recheck blood pressure in 1 month.  Check CBC, CMP, fasting lipid panel.  Goal LDL cholesterol is less than 100.  Screen for prostate cancer with a PSA.  Recommended the patient only take meloxicam rather than meloxicam and Celebrex due to potential renal toxicity.  Removed Celebrex from the medication list.  Offered the patient a  flu shot.

## 2020-04-09 LAB — CBC WITH DIFFERENTIAL/PLATELET
Absolute Monocytes: 480 cells/uL (ref 200–950)
Basophils Absolute: 32 cells/uL (ref 0–200)
Basophils Relative: 0.5 %
Eosinophils Absolute: 211 cells/uL (ref 15–500)
Eosinophils Relative: 3.3 %
HCT: 44 % (ref 38.5–50.0)
Hemoglobin: 15 g/dL (ref 13.2–17.1)
Lymphs Abs: 1491 cells/uL (ref 850–3900)
MCH: 30.2 pg (ref 27.0–33.0)
MCHC: 34.1 g/dL (ref 32.0–36.0)
MCV: 88.5 fL (ref 80.0–100.0)
MPV: 9.8 fL (ref 7.5–12.5)
Monocytes Relative: 7.5 %
Neutro Abs: 4186 cells/uL (ref 1500–7800)
Neutrophils Relative %: 65.4 %
Platelets: 237 10*3/uL (ref 140–400)
RBC: 4.97 10*6/uL (ref 4.20–5.80)
RDW: 12.1 % (ref 11.0–15.0)
Total Lymphocyte: 23.3 %
WBC: 6.4 10*3/uL (ref 3.8–10.8)

## 2020-04-09 LAB — COMPLETE METABOLIC PANEL WITH GFR
AG Ratio: 2 (calc) (ref 1.0–2.5)
ALT: 18 U/L (ref 9–46)
AST: 19 U/L (ref 10–35)
Albumin: 4 g/dL (ref 3.6–5.1)
Alkaline phosphatase (APISO): 70 U/L (ref 35–144)
BUN: 10 mg/dL (ref 7–25)
CO2: 27 mmol/L (ref 20–32)
Calcium: 9.2 mg/dL (ref 8.6–10.3)
Chloride: 103 mmol/L (ref 98–110)
Creat: 1.16 mg/dL (ref 0.70–1.25)
GFR, Est African American: 77 mL/min/{1.73_m2} (ref >=60)
GFR, Est Non African American: 66 mL/min/{1.73_m2} (ref >=60)
Globulin: 2 g/dL (calc) (ref 1.9–3.7)
Glucose, Bld: 98 mg/dL (ref 65–99)
Potassium: 4.1 mmol/L (ref 3.5–5.3)
Sodium: 136 mmol/L (ref 135–146)
Total Bilirubin: 0.9 mg/dL (ref 0.2–1.2)
Total Protein: 6 g/dL — ABNORMAL LOW (ref 6.1–8.1)

## 2020-04-09 LAB — LIPID PANEL
Cholesterol: 132 mg/dL (ref <200)
HDL: 44 mg/dL (ref >=40)
LDL Cholesterol (Calc): 73 mg/dL (calc)
Non-HDL Cholesterol (Calc): 88 mg/dL (calc) (ref <130)
Total CHOL/HDL Ratio: 3 (calc) (ref <5.0)
Triglycerides: 73 mg/dL (ref <150)

## 2020-04-09 LAB — PSA: PSA: 0.73 ng/mL (ref <=4.0)

## 2020-04-11 ENCOUNTER — Encounter: Payer: Self-pay | Admitting: *Deleted

## 2020-04-13 ENCOUNTER — Other Ambulatory Visit: Payer: Self-pay

## 2020-04-13 DIAGNOSIS — I10 Essential (primary) hypertension: Secondary | ICD-10-CM

## 2020-04-13 MED ORDER — LOSARTAN POTASSIUM 100 MG PO TABS
100.0000 mg | ORAL_TABLET | Freq: Every day | ORAL | 3 refills | Status: DC
Start: 2020-04-13 — End: 2020-09-12

## 2020-04-13 MED ORDER — ATENOLOL 50 MG PO TABS: 50.0000 mg | ORAL_TABLET | Freq: Every day | ORAL | 4 refills | Status: DC

## 2020-05-07 ENCOUNTER — Other Ambulatory Visit: Payer: Self-pay | Admitting: Family Medicine

## 2020-05-07 DIAGNOSIS — I10 Essential (primary) hypertension: Secondary | ICD-10-CM

## 2020-06-06 ENCOUNTER — Other Ambulatory Visit: Payer: Self-pay | Admitting: Family Medicine

## 2020-06-29 DIAGNOSIS — H1045 Other chronic allergic conjunctivitis: Secondary | ICD-10-CM | POA: Diagnosis not present

## 2020-06-29 DIAGNOSIS — H0102A Squamous blepharitis right eye, upper and lower eyelids: Secondary | ICD-10-CM | POA: Diagnosis not present

## 2020-06-29 DIAGNOSIS — H0102B Squamous blepharitis left eye, upper and lower eyelids: Secondary | ICD-10-CM | POA: Diagnosis not present

## 2020-06-29 DIAGNOSIS — H40013 Open angle with borderline findings, low risk, bilateral: Secondary | ICD-10-CM | POA: Diagnosis not present

## 2020-07-19 ENCOUNTER — Encounter: Payer: Self-pay | Admitting: Podiatry

## 2020-07-19 ENCOUNTER — Other Ambulatory Visit: Payer: Self-pay

## 2020-07-19 ENCOUNTER — Ambulatory Visit (INDEPENDENT_AMBULATORY_CARE_PROVIDER_SITE_OTHER): Payer: 59 | Admitting: Podiatry

## 2020-07-19 ENCOUNTER — Ambulatory Visit: Payer: 59

## 2020-07-19 DIAGNOSIS — M778 Other enthesopathies, not elsewhere classified: Secondary | ICD-10-CM

## 2020-07-19 MED ORDER — TRIAMCINOLONE ACETONIDE 40 MG/ML IJ SUSP
40.0000 mg | Freq: Once | INTRAMUSCULAR | Status: AC
  Administered 2020-07-19: 40 mg

## 2020-07-20 NOTE — Progress Notes (Signed)
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  Objective: Vital signs are stable alert oriented x3 has pain with palpation and range of motion of the second metatarsophalangeal joints bilateral.  Radiographs taken do not demonstrate any changes in his previous radiographs  Assessment: Capsulitis second metatarsophalangeal joint.  Plan: At this point injected periarticular lead with 10 mg Kenalog 5 mg Marcaine around the joint.  We discussed appropriate shoe gear stretching exercise ice therapy shoe modifications we will follow-up with him in the near future for reevaluation.

## 2020-08-26 ENCOUNTER — Encounter: Payer: Self-pay | Admitting: Family Medicine

## 2020-08-26 ENCOUNTER — Other Ambulatory Visit: Payer: Self-pay

## 2020-08-26 ENCOUNTER — Ambulatory Visit (INDEPENDENT_AMBULATORY_CARE_PROVIDER_SITE_OTHER): Payer: Medicare Other | Admitting: Family Medicine

## 2020-08-26 VITALS — BP 178/98 | HR 60 | Temp 98.9°F | Resp 14 | Ht 66.0 in | Wt 175.0 lb

## 2020-08-26 DIAGNOSIS — I10 Essential (primary) hypertension: Secondary | ICD-10-CM | POA: Diagnosis not present

## 2020-08-26 MED ORDER — HYDROCHLOROTHIAZIDE 25 MG PO TABS: 25.0000 mg | ORAL_TABLET | Freq: Every day | ORAL | 3 refills | Status: DC

## 2020-08-26 NOTE — Progress Notes (Signed)
Subjective:    Patient ID: Paul Hensley, male    DOB: [birth year], 65 y.o.   MRN: [id number]  HPI  Patient's blood pressure today is extremely high.  He states he recently went to the dentist to have some dental work done and his blood pressure was close to 180 systolic.  I verified with the patient that he is taking doxazosin, atenolol, and losartan every day.  However he continues to state that his blood pressure is better at home.  I do question if he may have an element of whitecoat syndrome.  He denies any chest pain shortness of breath or dyspnea on exertion.  He states that sometimes when he is working his blood pressure gets too low.  Again he is unable to provide any specifics as to the actual number but he does report feeling dizziness.  In the past he has tried amlodipine and has significant swelling in his legs.  He has had significant cramping with hydrochlorothiazide Past Medical History:  Diagnosis Date  . GAD (generalized anxiety disorder)   . Hiatal hernia   . High triglycerides   . Hypertension    History reviewed. No pertinent surgical history. Current Outpatient Medications on File Prior to Visit  Medication Sig Dispense Refill  . atenolol (TENORMIN) 50 MG tablet Take 1 tablet (50 mg total) by mouth daily. 30 tablet 4  . doxazosin (CARDURA) 4 MG tablet Take 1 tablet (4 mg total) by mouth daily. 30 tablet 5  . finasteride (PROSCAR) 5 MG tablet Take 1 tablet by mouth once daily 30 tablet 3  . losartan (COZAAR) 100 MG tablet Take 1 tablet (100 mg total) by mouth daily. 30 tablet 3  . lovastatin (MEVACOR) 20 MG tablet TAKE 1 TABLET BY MOUTH ONCE DAILY AT BEDTIME (Patient not taking: Reported on 08/26/2020) 90 tablet 1  . meloxicam (MOBIC) 15 MG tablet Take 1 tablet by mouth once daily (Patient not taking: Reported on 08/26/2020) 30 tablet 6   No current facility-administered medications on file prior to visit.   Allergies  Allergen Reactions  . Effexor [Venlafaxine  Hydrochloride]    Social History   Socioeconomic History  . Marital status: Married    Spouse name: Not on file  . Number of children: Not on file  . Years of education: Not on file  . Highest education level: Not on file  Occupational History  . Not on file  Tobacco Use  . Smoking status: Former Smoker    Types: Cigarettes    Quit date: 10/02/1971    Years since quitting: 48.9  . Smokeless tobacco: Never Used  Substance and Sexual Activity  . Alcohol use: No  . Drug use: No  . Sexual activity: Not on file  Other Topics Concern  . Not on file  Social History Narrative  . Not on file   Social Determinants of Health   Financial Resource Strain: Not on file  Food Insecurity: Not on file  Transportation Needs: Not on file  Physical Activity: Not on file  Stress: Not on file  Social Connections: Not on file  Intimate Partner Violence: Not on file     Review of Systems  All other systems reviewed and are negative.      Objective:   Physical Exam Vitals reviewed.  Constitutional:      General: He is not in acute distress.    Appearance: He is well-developed. He is not diaphoretic.  Cardiovascular:     Rate and  Rhythm: Normal rate and regular rhythm.     Heart sounds: Normal heart sounds. No murmur heard. No friction rub. No gallop.   Pulmonary:     Effort: Pulmonary effort is normal. No respiratory distress.     Breath sounds: Normal breath sounds. No stridor. No wheezing or rales.  Abdominal:     General: Bowel sounds are normal. There is no distension.     Palpations: Abdomen is soft. There is no mass.     Tenderness: There is no abdominal tenderness. There is no guarding.  Musculoskeletal:     Right foot: Normal range of motion. No swelling, deformity, laceration, tenderness, bony tenderness or crepitus.     Left foot: Normal range of motion. No swelling, deformity, laceration, tenderness, bony tenderness or crepitus.      Assessment & Plan:  Blood  pressure is elevated.  Add hydrochlorothiazide 25 mg a day and recheck blood pressure in 1 week.  Encouraged the patient to check his blood pressure 2-3 times a day at home to see if there is any kind of trend that would suggest whitecoat syndrome.

## 2020-09-02 ENCOUNTER — Encounter: Payer: Self-pay | Admitting: *Deleted

## 2020-09-02 ENCOUNTER — Telehealth: Payer: Self-pay | Admitting: *Deleted

## 2020-09-02 NOTE — Telephone Encounter (Signed)
Letter transcribed.   Call placed to patient. LMTRC.

## 2020-09-02 NOTE — Telephone Encounter (Signed)
His bp is controlled and we can draft a note clearing him to have his dental work done.

## 2020-09-02 NOTE — Telephone Encounter (Signed)
Patient dropped off BP readings for provider to review.   Noted BP ranging from 114- 145/ 55- 70, with one outlier BP reading of 178/98.   PCP reviewed and noted that readings WNL. No changes made to medications.   Of note, patient had also attached note to BP readings that he requires letter from PCP that states that he is able to have teeth pulled for Dr Allena Katz, [facility], in [place].   Please advise.

## 2020-09-09 ENCOUNTER — Other Ambulatory Visit: Payer: Self-pay | Admitting: Family Medicine

## 2020-09-15 ENCOUNTER — Ambulatory Visit (INDEPENDENT_AMBULATORY_CARE_PROVIDER_SITE_OTHER): Payer: Medicare Other | Admitting: Podiatry

## 2020-09-15 ENCOUNTER — Encounter: Payer: Self-pay | Admitting: Podiatry

## 2020-09-15 ENCOUNTER — Other Ambulatory Visit: Payer: Self-pay

## 2020-09-15 DIAGNOSIS — G5781 Other specified mononeuropathies of right lower limb: Secondary | ICD-10-CM

## 2020-09-15 DIAGNOSIS — G5762 Lesion of plantar nerve, left lower limb: Secondary | ICD-10-CM

## 2020-09-15 DIAGNOSIS — G5761 Lesion of plantar nerve, right lower limb: Secondary | ICD-10-CM

## 2020-09-15 DIAGNOSIS — G5782 Other specified mononeuropathies of left lower limb: Secondary | ICD-10-CM

## 2020-09-15 MED ORDER — TRIAMCINOLONE ACETONIDE 40 MG/ML IJ SUSP
40.0000 mg | Freq: Once | INTRAMUSCULAR | Status: AC
  Administered 2020-09-15: 40 mg

## 2020-09-15 NOTE — Progress Notes (Signed)
He presents today for follow-up of his capsulitis second metatarsophalangeal joints bilaterally.  States that they still feel sore in about the same spot.  He states that they really good for a few days and then become more painful.  Objective: Vital signs are stable he is alert and oriented x3.  Pulses are palpable.  He no longer has pain or swelling of the palpation of the second metatarsophalangeal joint on palpation of the plantar aspect of the second metatarsophalangeal joint of the bilateral foot.  He does however have pain on palpation to the third interdigital space which is consistent with a neuroma and a palpable Mulder's click.  Assessment: Resolving capsulitis of the second metatarsophalangeal joint neuroma third interdigital space bilaterally.  Plan: I injected the third interdigital space today with 10 mg Kenalog 5 mg Marcaine point of maximal tenderness.  Tolerated procedure well without complications and I will follow-up with him in about another month or so may need to consider orthotics at that time.

## 2020-09-28 DIAGNOSIS — H1131 Conjunctival hemorrhage, right eye: Secondary | ICD-10-CM | POA: Diagnosis not present

## 2020-10-09 ENCOUNTER — Other Ambulatory Visit: Payer: Self-pay | Admitting: Family Medicine

## 2020-10-09 DIAGNOSIS — I10 Essential (primary) hypertension: Secondary | ICD-10-CM

## 2020-10-27 ENCOUNTER — Ambulatory Visit: Payer: Medicare Other | Admitting: Podiatry

## 2020-10-27 ENCOUNTER — Encounter: Payer: Self-pay | Admitting: Podiatry

## 2020-10-27 ENCOUNTER — Other Ambulatory Visit: Payer: Self-pay

## 2020-10-27 DIAGNOSIS — G5782 Other specified mononeuropathies of left lower limb: Secondary | ICD-10-CM | POA: Diagnosis not present

## 2020-10-27 DIAGNOSIS — G5761 Lesion of plantar nerve, right lower limb: Secondary | ICD-10-CM

## 2020-10-27 DIAGNOSIS — G5781 Other specified mononeuropathies of right lower limb: Secondary | ICD-10-CM

## 2020-10-27 MED ORDER — TRIAMCINOLONE ACETONIDE 40 MG/ML IJ SUSP
40.0000 mg | Freq: Once | INTRAMUSCULAR | Status: AC
  Administered 2020-10-27: 40 mg

## 2020-10-29 NOTE — Progress Notes (Signed)
He presents today for follow-up of his neuromas and capsulitis.  States that they really do not hurt a lot more but still feel quite a bit quite stiff.  Objective: Vital signs are stable alert oriented x3.  Pulses are palpable.  He has pain on palpation to the fourth interdigital space of the left foot and third interdigital space of the right foot.  Palpable Mulder's clicks are noted.  Assessment: Neuroma third interdigital space right foot fourth interdigital space left foot.  Plan: Discussed etiology pathology conservative surgical therapies at this point we discussed the possible need for dehydrated alcohol however we went ahead and reinjected the bilateral heels 20 mg Kenalog 5 mg Marcaine point maximal tenderness.  Tolerated procedure well.  Discussed appropriate shoe gear and I will follow-up with him in 4 to 6 weeks

## 2020-11-06 ENCOUNTER — Other Ambulatory Visit: Payer: Self-pay | Admitting: Family Medicine

## 2020-11-06 DIAGNOSIS — I10 Essential (primary) hypertension: Secondary | ICD-10-CM

## 2020-12-03 ENCOUNTER — Other Ambulatory Visit: Payer: Self-pay | Admitting: Family Medicine

## 2020-12-03 DIAGNOSIS — I10 Essential (primary) hypertension: Secondary | ICD-10-CM

## 2020-12-29 ENCOUNTER — Encounter: Payer: Self-pay | Admitting: Podiatry

## 2020-12-29 ENCOUNTER — Other Ambulatory Visit: Payer: Self-pay

## 2020-12-29 ENCOUNTER — Ambulatory Visit: Payer: Medicare Other | Admitting: Podiatry

## 2020-12-29 DIAGNOSIS — G5761 Lesion of plantar nerve, right lower limb: Secondary | ICD-10-CM | POA: Diagnosis not present

## 2020-12-29 DIAGNOSIS — G5782 Other specified mononeuropathies of left lower limb: Secondary | ICD-10-CM

## 2020-12-29 DIAGNOSIS — G5781 Other specified mononeuropathies of right lower limb: Secondary | ICD-10-CM

## 2020-12-29 DIAGNOSIS — G5762 Lesion of plantar nerve, left lower limb: Secondary | ICD-10-CM | POA: Diagnosis not present

## 2020-12-29 NOTE — Progress Notes (Signed)
He presents today states that the neuroma is really are not feeling any better at all.  States that at nighttime they are not nearly as bad but during the daytime they are still just as painful.  Objective: Vital signs are stable he is alert oriented x3.  Pulses are palpable still has a palpable Mulder's click third interspace bilateral with pain on palpation stating that this is the area that hurts.  Also goes on to state that it does cause cramping as well.  Assessment: Neuroma third interspace bilateral.  Plan: Injected his first dose of dehydrated alcohol today total of 1 cc was injected onto the bilateral third interdigital space after Betadine skin prep and I will follow-up with him in 1 month

## 2021-01-07 ENCOUNTER — Other Ambulatory Visit: Payer: Self-pay | Admitting: Family Medicine

## 2021-01-14 ENCOUNTER — Other Ambulatory Visit: Payer: Self-pay | Admitting: Family Medicine

## 2021-02-02 ENCOUNTER — Encounter: Payer: Self-pay | Admitting: Podiatry

## 2021-02-02 ENCOUNTER — Other Ambulatory Visit: Payer: Self-pay

## 2021-02-02 ENCOUNTER — Ambulatory Visit: Payer: Medicare Other | Admitting: Podiatry

## 2021-02-02 DIAGNOSIS — G5762 Lesion of plantar nerve, left lower limb: Secondary | ICD-10-CM | POA: Diagnosis not present

## 2021-02-02 DIAGNOSIS — G5782 Other specified mononeuropathies of left lower limb: Secondary | ICD-10-CM

## 2021-02-02 DIAGNOSIS — G5761 Lesion of plantar nerve, right lower limb: Secondary | ICD-10-CM | POA: Diagnosis not present

## 2021-02-02 DIAGNOSIS — G5781 Other specified mononeuropathies of right lower limb: Secondary | ICD-10-CM

## 2021-02-02 NOTE — Progress Notes (Signed)
He presents today for follow-up of his neuromas.  States that there is not much of a change yet.  Objective: Vital signs are stable he is alert oriented x3 he relates that there is not as much pain on palpation as I compress the third interdigital space bilaterally.  Palpable Mulder's click greater left than right.    Assessment: Well healing neuromas bilateral.  Plan: We injected his second dose of dehydrated alcohol to the third interdigital space bilaterally today tolerated procedure well without complications follow-up with him in 1 month

## 2021-02-23 ENCOUNTER — Ambulatory Visit: Payer: Medicare Other | Admitting: Podiatry

## 2021-02-27 ENCOUNTER — Other Ambulatory Visit: Payer: Self-pay | Admitting: Family Medicine

## 2021-03-01 ENCOUNTER — Other Ambulatory Visit: Payer: Self-pay | Admitting: Family Medicine

## 2021-03-12 ENCOUNTER — Other Ambulatory Visit: Payer: Self-pay | Admitting: Family Medicine

## 2021-03-12 DIAGNOSIS — I10 Essential (primary) hypertension: Secondary | ICD-10-CM

## 2021-03-14 ENCOUNTER — Encounter: Payer: Self-pay | Admitting: Podiatry

## 2021-03-14 ENCOUNTER — Other Ambulatory Visit: Payer: Self-pay

## 2021-03-14 ENCOUNTER — Ambulatory Visit: Payer: Medicare Other | Admitting: Podiatry

## 2021-03-14 DIAGNOSIS — G5761 Lesion of plantar nerve, right lower limb: Secondary | ICD-10-CM

## 2021-03-14 DIAGNOSIS — G5782 Other specified mononeuropathies of left lower limb: Secondary | ICD-10-CM

## 2021-03-14 DIAGNOSIS — G5762 Lesion of plantar nerve, left lower limb: Secondary | ICD-10-CM | POA: Diagnosis not present

## 2021-03-14 DIAGNOSIS — G5781 Other specified mononeuropathies of right lower limb: Secondary | ICD-10-CM

## 2021-03-14 NOTE — Progress Notes (Signed)
He presents today for follow-up of his neuromas bilaterally.  He states that he has been doing quite well and that after the last injection his left leg which had been painful has resolved completely.  He states that the neuromas however really have not changed very much.  He goes on to say that his mother and sister have neuropathy.  Objective: Vital signs are stable he is alert and oriented x3.  Pulses are palpable.  He has a palpable Mulder's click third interdigital space left greater than right.  Assessment: Neuroma neuroma third interdigital space left greater than right.  Plan: Injected third interdigital space today with 1-1/2 cc of 4% dehydrated alcohol.  Follow-up with him in 1 month this was his third dose.

## 2021-04-08 ENCOUNTER — Other Ambulatory Visit: Payer: Self-pay | Admitting: Family Medicine

## 2021-04-11 ENCOUNTER — Other Ambulatory Visit: Payer: Self-pay

## 2021-04-11 ENCOUNTER — Ambulatory Visit: Payer: Medicare Other | Admitting: Podiatry

## 2021-04-11 ENCOUNTER — Encounter: Payer: Self-pay | Admitting: Podiatry

## 2021-04-11 DIAGNOSIS — G5782 Other specified mononeuropathies of left lower limb: Secondary | ICD-10-CM

## 2021-04-11 DIAGNOSIS — G5781 Other specified mononeuropathies of right lower limb: Secondary | ICD-10-CM | POA: Diagnosis not present

## 2021-04-12 NOTE — Progress Notes (Signed)
He presents today for follow-up of his neuromas.  States that they seem to be getting getting better.  States that the left is better than the right.  Objective: Pulses are palpable.  He still has palpable Mulder's click to the third interspace bilateral.  Assessment: Neuroma bilateral.  Plan: Reinjected his fourth dose of dehydrated alcohol today 230 todaybilateral follow-up with him in 1 month

## 2021-04-14 ENCOUNTER — Other Ambulatory Visit: Payer: Self-pay | Admitting: Family Medicine

## 2021-04-23 ENCOUNTER — Other Ambulatory Visit: Payer: Self-pay | Admitting: Family Medicine

## 2021-05-11 ENCOUNTER — Ambulatory Visit: Payer: Medicare Other | Admitting: Podiatry

## 2021-05-11 ENCOUNTER — Other Ambulatory Visit: Payer: Self-pay

## 2021-05-11 ENCOUNTER — Encounter: Payer: Self-pay | Admitting: Podiatry

## 2021-05-11 DIAGNOSIS — G5781 Other specified mononeuropathies of right lower limb: Secondary | ICD-10-CM

## 2021-05-11 DIAGNOSIS — G5782 Other specified mononeuropathies of left lower limb: Secondary | ICD-10-CM | POA: Diagnosis not present

## 2021-05-11 NOTE — Progress Notes (Signed)
He presents today for follow-up of his neuroma third interdigital space bilaterally.  States my feet still feel numb by the end of the day all the way up to my midfoot and ankle.  States that my feet are feeling better than they were prior to starting the injections but it is starting to feel different.  Objective: Vital signs are stable he is alert and oriented x3.  Pulses are palpable.  Last time he was and we injected his fourth dose of dehydrated alcohol to the third interdigital space bilaterally.  Assessment neuroma third interdigital space bilateral improving.  Plan: Reinjected today dehydrated alcohol.  This was his fifth dose I will follow-up with him in 1 month

## 2021-06-04 ENCOUNTER — Other Ambulatory Visit: Payer: Self-pay | Admitting: Family Medicine

## 2021-06-05 ENCOUNTER — Other Ambulatory Visit: Payer: Self-pay | Admitting: Family Medicine

## 2021-06-05 DIAGNOSIS — I10 Essential (primary) hypertension: Secondary | ICD-10-CM

## 2021-06-08 ENCOUNTER — Ambulatory Visit: Payer: Medicare Other | Admitting: Podiatry

## 2021-06-08 ENCOUNTER — Other Ambulatory Visit: Payer: Self-pay

## 2021-06-08 ENCOUNTER — Encounter: Payer: Self-pay | Admitting: Podiatry

## 2021-06-08 DIAGNOSIS — G5782 Other specified mononeuropathies of left lower limb: Secondary | ICD-10-CM

## 2021-06-08 DIAGNOSIS — G5781 Other specified mononeuropathies of right lower limb: Secondary | ICD-10-CM | POA: Diagnosis not present

## 2021-06-08 NOTE — Progress Notes (Signed)
He presents today for follow-up of his neuroma third interspace bilaterally.  States that he does not feel that his feet are as numb as they were.  States that they are not as stiff as they were he does have 1 tender spot centrally located in the plantar left heel.  Objective: Vital signs are stable alert oriented x3 no reproducible pain on palpation to the left heel though he does still has some tenderness on palpation to the third interdigital space bilaterally other interdigital spaces did not demonstrate any type of Mulder's clicks.  Assessment: Neuroma resolving third interdigital space bilateral.  Plan: Discussed etiology pathology conservative surgical therapies injected another dose of dehydrated alcohol 1.5 cc of 4% dehydrated alcohol and local anesthetic was utilized.  He tolerated procedure well I will follow-up with him in 1 month questions or concerns he will notify us immediately we did discuss the possible need for orthotics regarding his pain in his midfoot and heel as well as forefoot.

## 2021-06-10 ENCOUNTER — Other Ambulatory Visit: Payer: Self-pay | Admitting: Family Medicine

## 2021-06-21 ENCOUNTER — Other Ambulatory Visit: Payer: Self-pay | Admitting: Family Medicine

## 2021-07-06 ENCOUNTER — Ambulatory Visit: Payer: Medicare Other | Admitting: Podiatry

## 2021-07-06 ENCOUNTER — Other Ambulatory Visit: Payer: Self-pay

## 2021-07-06 DIAGNOSIS — G5781 Other specified mononeuropathies of right lower limb: Secondary | ICD-10-CM

## 2021-07-06 DIAGNOSIS — G5782 Other specified mononeuropathies of left lower limb: Secondary | ICD-10-CM

## 2021-07-06 NOTE — Progress Notes (Signed)
He presents today states that his feet are doing better he states that they are feeling much better than they were a year ago at this time.  Objective: Vital signs are stable he is alert and oriented x3.  Pulses are palpable.  He still has some tenderness on palpation to the third interdigital space bilateral.  Assessment: Neuroma third interspace resolving.  Plan: We injected third interspace bilateral foot today follow-up with him in 1 month

## 2021-07-13 ENCOUNTER — Telehealth: Payer: Self-pay | Admitting: Family Medicine

## 2021-07-13 NOTE — Telephone Encounter (Signed)
Left message for patient to call back and schedule Medicare Annual Wellness Visit (AWV) in office.  ° °If not able to come in office, please offer to do virtually or by telephone.  Left office number and my jabber #[phone]. ° °Due for AWVI ° °Please schedule at anytime with Nurse Health Advisor. °  °

## 2021-07-23 ENCOUNTER — Other Ambulatory Visit: Payer: Self-pay | Admitting: Family Medicine

## 2021-08-01 ENCOUNTER — Other Ambulatory Visit: Payer: Self-pay

## 2021-08-01 ENCOUNTER — Encounter: Payer: Self-pay | Admitting: Podiatry

## 2021-08-01 ENCOUNTER — Ambulatory Visit: Payer: Medicare Other | Admitting: Podiatry

## 2021-08-01 DIAGNOSIS — G5781 Other specified mononeuropathies of right lower limb: Secondary | ICD-10-CM | POA: Diagnosis not present

## 2021-08-01 DIAGNOSIS — G5782 Other specified mononeuropathies of left lower limb: Secondary | ICD-10-CM | POA: Diagnosis not present

## 2021-08-02 NOTE — Progress Notes (Signed)
He presents today for follow-up of his neuroma third interdigital space bilaterally he states that we are almost done I think.  Objective: Vital signs are stable he is alert and oriented x3 there is no erythema edema/drainage noted he has very little in the way of tenderness with exception of the most distal aspect of the third interdigital space bilaterally.  Assessment: Resolving neuroma.  Plan: I injected dehydrated alcohol third interspace bilateral.  He will follow-up with [place] in 1 month

## 2021-08-11 ENCOUNTER — Telehealth: Payer: Self-pay | Admitting: Family Medicine

## 2021-08-11 NOTE — Telephone Encounter (Signed)
Left message for patient to call back and schedule Medicare Annual Wellness Visit (AWV) in office.  ° °If not able to come in office, please offer to do virtually or by telephone.  Left office number and my jabber #[phone]. ° °Due for AWVI ° °Please schedule at anytime with Nurse Health Advisor. °  °

## 2021-08-23 DIAGNOSIS — H0102B Squamous blepharitis left eye, upper and lower eyelids: Secondary | ICD-10-CM | POA: Diagnosis not present

## 2021-08-23 DIAGNOSIS — H40013 Open angle with borderline findings, low risk, bilateral: Secondary | ICD-10-CM | POA: Diagnosis not present

## 2021-08-23 DIAGNOSIS — H0102A Squamous blepharitis right eye, upper and lower eyelids: Secondary | ICD-10-CM | POA: Diagnosis not present

## 2021-08-23 DIAGNOSIS — H1045 Other chronic allergic conjunctivitis: Secondary | ICD-10-CM | POA: Diagnosis not present

## 2021-09-03 ENCOUNTER — Other Ambulatory Visit: Payer: Self-pay | Admitting: Family Medicine

## 2021-09-03 DIAGNOSIS — I10 Essential (primary) hypertension: Secondary | ICD-10-CM

## 2021-09-05 ENCOUNTER — Ambulatory Visit: Payer: Medicare Other | Admitting: Podiatry

## 2021-09-05 ENCOUNTER — Encounter: Payer: Self-pay | Admitting: Podiatry

## 2021-09-05 ENCOUNTER — Other Ambulatory Visit: Payer: Self-pay

## 2021-09-05 DIAGNOSIS — G5781 Other specified mononeuropathies of right lower limb: Secondary | ICD-10-CM | POA: Diagnosis not present

## 2021-09-05 DIAGNOSIS — G5782 Other specified mononeuropathies of left lower limb: Secondary | ICD-10-CM

## 2021-09-06 NOTE — Progress Notes (Signed)
He presents today for follow-up of his neuromas he states they are much better than they were he states they just feel stiff.  Objective: Vital signs are stable he is alert and oriented x3.  Pulses remain palpable.  He has some tenderness on palpation to the second and third interdigital space of the bilateral foot.  Assessment: Neuroma bilateral  Plan: I injected another dose of dehydrated alcohol to these areas.  I think next visit if he still having stiffness I would consider cortisone.

## 2021-09-19 ENCOUNTER — Encounter: Payer: Self-pay | Admitting: Family Medicine

## 2021-09-19 ENCOUNTER — Other Ambulatory Visit: Payer: Self-pay

## 2021-09-19 ENCOUNTER — Ambulatory Visit (INDEPENDENT_AMBULATORY_CARE_PROVIDER_SITE_OTHER): Payer: Medicare Other | Admitting: Family Medicine

## 2021-09-19 VITALS — BP 152/72 | HR 71 | Temp 97.5°F | Resp 18 | Ht 66.0 in | Wt 173.0 lb

## 2021-09-19 DIAGNOSIS — I10 Essential (primary) hypertension: Secondary | ICD-10-CM

## 2021-09-19 DIAGNOSIS — N400 Enlarged prostate without lower urinary tract symptoms: Secondary | ICD-10-CM | POA: Diagnosis not present

## 2021-09-19 DIAGNOSIS — R300 Dysuria: Secondary | ICD-10-CM

## 2021-09-19 DIAGNOSIS — Z125 Encounter for screening for malignant neoplasm of prostate: Secondary | ICD-10-CM | POA: Diagnosis not present

## 2021-09-19 DIAGNOSIS — E7800 Pure hypercholesterolemia, unspecified: Secondary | ICD-10-CM

## 2021-09-19 LAB — URINALYSIS, ROUTINE W REFLEX MICROSCOPIC
Bacteria, UA: NONE SEEN /HPF
Bilirubin Urine: NEGATIVE
Casts: NONE SEEN /LPF
Crystals: NONE SEEN /HPF
Hgb urine dipstick: NEGATIVE
Hyaline Cast: NONE SEEN /LPF
Leukocytes,Ua: NEGATIVE
Nitrite: POSITIVE — AB
Specific Gravity, Urine: 1.025 (ref 1.001–1.035)
WBC, UA: NONE SEEN /HPF (ref 0–5)
Yeast: NONE SEEN /HPF
pH: 7 (ref 5.0–8.0)

## 2021-09-19 LAB — MICROSCOPIC MESSAGE

## 2021-09-19 NOTE — Progress Notes (Signed)
? ?Subjective:  ? ? Patient ID: Paul Hensley, male    DOB: 1956/06/25, 66 y.o.   MRN: [id number] ? ?HPI  ?Patient is a very pleasant 66 year old Caucasian gentleman here today to recheck his blood pressure.  His blood pressure here is elevated.  However he has whitecoat syndrome.  He states that he is checking his blood pressure frequently at home and his systolic blood pressure is usually between 115 and 125.  He denies any chest pain shortness of breath or dyspnea on exertion however he discontinued lovastatin and he discontinued hydrochlorothiazide due to cramps.  He continues to have cramps primarily in his upper thighs and calves when he is working.  I feel that it is likely due to dehydration.  However I agree to avoid hydrochlorothiazide for this individual.  I do not feel that the lovastatin is playing a role in his cramps.  He also recently experienced dysuria.  However his urinalysis today shows trace glucose, trace ketones, trace protein, positive nitrates, negative blood, negative leukocyte esterase.  On microscopy, there are no white blood cells, there is no bacteria.  I suspect dehydration was the cause of the dysuria ? ?Past Medical History:  ?Diagnosis Date  ? GAD (generalized anxiety disorder)   ? Hiatal hernia   ? High triglycerides   ? Hypertension   ? ?No past surgical history on file. ?Current Outpatient Medications on File Prior to Visit  ?Medication Sig Dispense Refill  ? atenolol (TENORMIN) 50 MG tablet Take 1 tablet (50 mg total) by mouth daily. PT NEEDS OV FOR FURTHER REFILLS 30 tablet 0  ? doxazosin (CARDURA) 4 MG tablet Take 1 tablet by mouth once daily 90 tablet 0  ? finasteride (PROSCAR) 5 MG tablet Take 1 tablet by mouth once daily 30 tablet 0  ? hydrochlorothiazide (HYDRODIURIL) 25 MG tablet Take 1 tablet (25 mg total) by mouth daily. 90 tablet 3  ? losartan (COZAAR) 100 MG tablet Take 1 tablet (100 mg total) by mouth daily. PT NEEDS OV FOR FURTHER REFILLS 30 tablet 0  ? lovastatin  (MEVACOR) 20 MG tablet TAKE 1 TABLET BY MOUTH ONCE DAILY AT BEDTIME 90 tablet 0  ? meloxicam (MOBIC) 15 MG tablet Take 1 tablet (15 mg total) by mouth daily. PT NEEDS OV FOR FURTHER REFILLS 30 tablet 0  ? ?No current facility-administered medications on file prior to visit.  ? ?Allergies  ?Allergen Reactions  ? Effexor [Venlafaxine Hydrochloride]   ? ?Social History  ? ?Socioeconomic History  ? Marital status: Married  ?  Spouse name: Not on file  ? Number of children: Not on file  ? Years of education: Not on file  ? Highest education level: Not on file  ?Occupational History  ? Not on file  ?Tobacco Use  ? Smoking status: Former  ?  Types: Cigarettes  ?  Quit date: 10/02/1971  ?  Years since quitting: 50.0  ? Smokeless tobacco: Never  ?Substance and Sexual Activity  ? Alcohol use: No  ? Drug use: No  ? Sexual activity: Not on file  ?Other Topics Concern  ? Not on file  ?Social History Narrative  ? Not on file  ? ?Social Determinants of Health  ? ?Financial Resource Strain: Not on file  ?Food Insecurity: Not on file  ?Transportation Needs: Not on file  ?Physical Activity: Not on file  ?Stress: Not on file  ?Social Connections: Not on file  ?Intimate Partner Violence: Not on file  ? ? ? ?Review  of Systems  ?All other systems reviewed and are negative. ? ?   ?Objective:  ? Physical Exam ?Vitals reviewed.  ?Constitutional:   ?   General: He is not in acute distress. ?   Appearance: He is well-developed. He is not diaphoretic.  ?Cardiovascular:  ?   Rate and Rhythm: Normal rate and regular rhythm.  ?   Heart sounds: Normal heart sounds. No murmur heard. ?  No friction rub. No gallop.  ?Pulmonary:  ?   Effort: Pulmonary effort is normal. No respiratory distress.  ?   Breath sounds: Normal breath sounds. No stridor. No wheezing or rales.  ?Abdominal:  ?   General: Bowel sounds are normal. There is no distension.  ?   Palpations: Abdomen is soft. There is no mass.  ?   Tenderness: There is no abdominal tenderness. There is  no guarding.  ?Musculoskeletal:  ?   Right foot: Normal range of motion. No swelling, deformity, laceration, tenderness, bony tenderness or crepitus.  ?   Left foot: Normal range of motion. No swelling, deformity, laceration, tenderness, bony tenderness or crepitus.  ?   ?Assessment & Plan:  ?Benign essential HTN - Plan: CBC with Differential/Platelet, Lipid panel, COMPLETE METABOLIC PANEL WITH GFR ? ?Pure hypercholesterolemia - Plan: CBC with Differential/Platelet, Lipid panel, COMPLETE METABOLIC PANEL WITH GFR ? ?Prostate cancer screening - Plan: PSA ? ?Benign prostatic hyperplasia, unspecified whether lower urinary tract symptoms present - Plan: PSA ? ?Dysuria - Plan: Urinalysis, Routine w reflex microscopic ?No evidence of urinary tract infection.  I suspect the dehydration was the cause of his dysuria.  Also feel the dehydration is likely playing a role in the cramps.  Therefore I encouraged the patient to drink Gatorade when he is working.  Also recommended trying tonic water with quinine to see if that will help prevent the cramps.  I will check a CBC, CMP, fasting lipid panel.  If his LDL cholesterol is elevated, I would like the patient to resume lovastatin as I do not feel that this patient was suffering cramps due to lovastatin but rather dehydration.  Check a PSA to screen for prostate cancer.  No treatment is necessary for the dysuria but I did encourage him to try to drink more fluid. ?

## 2021-09-20 LAB — COMPLETE METABOLIC PANEL WITH GFR
AG Ratio: 1.8 (calc) (ref 1.0–2.5)
ALT: 17 U/L (ref 9–46)
AST: 18 U/L (ref 10–35)
Albumin: 4.2 g/dL (ref 3.6–5.1)
Alkaline phosphatase (APISO): 79 U/L (ref 35–144)
BUN: 15 mg/dL (ref 7–25)
CO2: 26 mmol/L (ref 20–32)
Calcium: 9.2 mg/dL (ref 8.6–10.3)
Chloride: 100 mmol/L (ref 98–110)
Creat: 1.1 mg/dL (ref 0.70–1.35)
Globulin: 2.4 g/dL (calc) (ref 1.9–3.7)
Glucose, Bld: 98 mg/dL (ref 65–99)
Potassium: 4.3 mmol/L (ref 3.5–5.3)
Sodium: 135 mmol/L (ref 135–146)
Total Bilirubin: 1 mg/dL (ref 0.2–1.2)
Total Protein: 6.6 g/dL (ref 6.1–8.1)
eGFR: 74 mL/min/{1.73_m2} (ref >=60)

## 2021-09-20 LAB — CBC WITH DIFFERENTIAL/PLATELET
Absolute Monocytes: 500 cells/uL (ref 200–950)
Basophils Absolute: 31 cells/uL (ref 0–200)
Basophils Relative: 0.5 %
Eosinophils Absolute: 177 cells/uL (ref 15–500)
Eosinophils Relative: 2.9 %
HCT: 47.1 % (ref 38.5–50.0)
Hemoglobin: 16.1 g/dL (ref 13.2–17.1)
Lymphs Abs: 1495 cells/uL (ref 850–3900)
MCH: 29.5 pg (ref 27.0–33.0)
MCHC: 34.2 g/dL (ref 32.0–36.0)
MCV: 86.4 fL (ref 80.0–100.0)
MPV: 9.4 fL (ref 7.5–12.5)
Monocytes Relative: 8.2 %
Neutro Abs: 3898 cells/uL (ref 1500–7800)
Neutrophils Relative %: 63.9 %
Platelets: 303 10*3/uL (ref 140–400)
RBC: 5.45 10*6/uL (ref 4.20–5.80)
RDW: 12.7 % (ref 11.0–15.0)
Total Lymphocyte: 24.5 %
WBC: 6.1 10*3/uL (ref 3.8–10.8)

## 2021-09-20 LAB — LIPID PANEL
Cholesterol: 129 mg/dL (ref <200)
HDL: 39 mg/dL — ABNORMAL LOW (ref >=40)
LDL Cholesterol (Calc): 75 mg/dL (calc)
Non-HDL Cholesterol (Calc): 90 mg/dL (calc) (ref <130)
Total CHOL/HDL Ratio: 3.3 (calc) (ref <5.0)
Triglycerides: 73 mg/dL (ref <150)

## 2021-09-20 LAB — PSA: PSA: 0.88 ng/mL (ref <=4.00)

## 2021-10-03 ENCOUNTER — Ambulatory Visit: Payer: Medicare Other | Admitting: Podiatry

## 2021-10-03 ENCOUNTER — Encounter: Payer: Self-pay | Admitting: Podiatry

## 2021-10-03 ENCOUNTER — Other Ambulatory Visit: Payer: Self-pay

## 2021-10-03 DIAGNOSIS — G5781 Other specified mononeuropathies of right lower limb: Secondary | ICD-10-CM

## 2021-10-03 DIAGNOSIS — G5782 Other specified mononeuropathies of left lower limb: Secondary | ICD-10-CM | POA: Diagnosis not present

## 2021-10-03 MED ORDER — TRIAMCINOLONE ACETONIDE 40 MG/ML IJ SUSP
40.0000 mg | Freq: Once | INTRAMUSCULAR | Status: AC
  Administered 2021-10-03: 40 mg

## 2021-10-04 NOTE — Progress Notes (Signed)
He presents today for follow-up of his neuroma third interdigital space bilaterally.  States that they are doing a lot better they just feel stiff now if I can just get rid of the stiffness I would be okay.  He states they are much better than when we started but I am on them all the time every day. ? ?Objective: Vital signs are stable he is alert and oriented x3.  Pulses are palpable.  He has pain to each and every intermetatarsal space though the pain in the third intermetatarsal space bilaterally has reduced considerably. ? ?Assessment: Neuroma third interspace bilateral capsulitis. ? ?Plan: At this point I injected the first second and third interdigital spaces today with 20 mg of Kenalog and local anesthetic divided amongst the 3 interspaces on both feet.  He tolerated this well and we will see if the stiffness is resolving at next visit.  I will follow-up with him in 1 month ?

## 2021-10-08 ENCOUNTER — Other Ambulatory Visit: Payer: Self-pay | Admitting: Family Medicine

## 2021-10-08 DIAGNOSIS — I10 Essential (primary) hypertension: Secondary | ICD-10-CM

## 2021-10-30 ENCOUNTER — Other Ambulatory Visit: Payer: Self-pay | Admitting: Family Medicine

## 2021-10-30 DIAGNOSIS — I10 Essential (primary) hypertension: Secondary | ICD-10-CM

## 2021-10-31 ENCOUNTER — Ambulatory Visit: Payer: Medicare Other | Admitting: Podiatry

## 2021-10-31 DIAGNOSIS — G5781 Other specified mononeuropathies of right lower limb: Secondary | ICD-10-CM | POA: Diagnosis not present

## 2021-10-31 DIAGNOSIS — G5782 Other specified mononeuropathies of left lower limb: Secondary | ICD-10-CM

## 2021-10-31 NOTE — Progress Notes (Signed)
He presents today but states that he has some improved and nearly 100% but is not there yet toes still feel tight.  After the cortisone injections last time he was good for about 2 weeks then went on a walk and he started to get tight again. ? ?Objective: Vital signs are stable he is alert and oriented x3 still has tenderness on palpation to the second and third interdigital spaces bilaterally. ? ?Chronic neuritis neuroma third interspace bilateral. ? ?Plan: I injected the second and third interdigital spaces bilaterally today with 2 mL of Kenalog divided between both spaces each foot. ?

## 2021-11-13 ENCOUNTER — Other Ambulatory Visit: Payer: Self-pay | Admitting: Family Medicine

## 2021-11-15 NOTE — Telephone Encounter (Signed)
Requested medication (s) are due for refill today:   Yes for both ? ?Requested medication (s) are on the active medication list:   Yes for both ? ?Future visit scheduled:   No.   Left voicemail to call and make appt for his physical ? ? ?Last ordered: Both 10/10/2021 #30, 0 refills. ? ?Returned because per note needs physical prior to more refills.   Courtesy 30 day refill given on 10/10/2021 so provider to review for further refills prior to being seen.  ? ?Requested Prescriptions  ?Pending Prescriptions Disp Refills  ? losartan (COZAAR) 100 MG tablet [Pharmacy Med Name: Losartan Potassium 100 MG Oral Tablet] 30 tablet 0  ?  Sig: TAKE 1 TABLET BY MOUTH ONCE DAILY . APPOINTMENT REQUIRED FOR FUTURE REFILLS  ?  ? Cardiovascular:  Angiotensin Receptor Blockers Failed - 11/13/2021  6:12 PM  ?  ?  Failed - Last BP in normal range  ?  BP Readings from Last 1 Encounters:  ?09/19/21 (!) 152/72  ?  ?  ?  ?  Passed - Cr in normal range and within 180 days  ?  Creat  ?Date Value Ref Range Status  ?09/19/2021 1.10 0.70 - 1.35 mg/dL Final  ?  ?  ?  ?  Passed - K in normal range and within 180 days  ?  Potassium  ?Date Value Ref Range Status  ?09/19/2021 4.3 3.5 - 5.3 mmol/L Final  ?  ?  ?  ?  Passed - Patient is not pregnant  ?  ?  Passed - Valid encounter within last 6 months  ?  Recent Outpatient Visits   ? ?      ? 1 month ago Benign essential HTN  ? [facility] Pickard, Cammie Mcgee, MD  ? 1 year ago Benign essential HTN  ? [facility] Pickard, Cammie Mcgee, MD  ? 1 year ago Benign essential HTN  ? [facility] Pickard, Cammie Mcgee, MD  ? 2 years ago Benign essential HTN  ? [facility] Pickard, Cammie Mcgee, MD  ? 2 years ago Benign essential HTN  ? [facility] Pickard, Cammie Mcgee, MD  ? ?  ?  ? ? ?  ?  ?  ? meloxicam (MOBIC) 15 MG tablet [Pharmacy Med Name: Meloxicam 15 MG Oral Tablet] 30 tablet 0  ?  Sig: TAKE 1 TABLET BY MOUTH ONCE DAILY . APPOINTMENT REQUIRED  FOR FUTURE REFILLS  ?  ? Analgesics:  COX2 Inhibitors Failed - 11/13/2021  6:12 PM  ?  ?  Failed - Manual Review: Labs are only required if the patient has taken medication for more than 8 weeks.  ?  ?  Passed - HGB in normal range and within 360 days  ?  Hemoglobin  ?Date Value Ref Range Status  ?09/19/2021 16.1 13.2 - 17.1 g/dL Final  ?  ?  ?  ?  Passed - Cr in normal range and within 360 days  ?  Creat  ?Date Value Ref Range Status  ?09/19/2021 1.10 0.70 - 1.35 mg/dL Final  ?  ?  ?  ?  Passed - HCT in normal range and within 360 days  ?  HCT  ?Date Value Ref Range Status  ?09/19/2021 47.1 38.5 - 50.0 % Final  ?  ?  ?  ?  Passed - AST in normal range and within 360 days  ?  AST  ?Date Value Ref Range  Status  ?09/19/2021 18 10 - 35 U/L Final  ?  ?  ?  ?  Passed - ALT in normal range and within 360 days  ?  ALT  ?Date Value Ref Range Status  ?09/19/2021 17 9 - 46 U/L Final  ?  ?  ?  ?  Passed - eGFR is 30 or above and within 360 days  ?  GFR, Est African American  ?Date Value Ref Range Status  ?04/08/2020 77 > OR = 60 mL/min/1.1m Final  ? ?GFR, Est Non African American  ?Date Value Ref Range Status  ?04/08/2020 66 > OR = 60 mL/min/1.727mFinal  ? ?eGFR  ?Date Value Ref Range Status  ?09/19/2021 74 > OR = 60 mL/min/1.7332minal  ?  Comment:  ?  The eGFR is based on the CKD-EPI 2021 equation. To calculate  ?the new eGFR from a previous Creatinine or Cystatin C ?result, go to https://www.kidney.org/professionals/ ?kdoqi/gfr%5Fcalculator ?  ?  ?  ?  ?  Passed - Patient is not pregnant  ?  ?  Passed - Valid encounter within last 12 months  ?  Recent Outpatient Visits   ? ?      ? 1 month [facility] War[facility]ammie McgeeD  ? 1 [facility] War[facility]  ? 1 year ago [facility]  [facility] War[facility]  ? 2 years ago [facility] HT[facility]  [facility]  [facility]D  ? 2 years ago [facility]ign essential HTN  ? [facility] War[facility]ie McgeeD  ? ?  ?  ? ? ?  ?  ?  ? ?

## 2021-12-14 ENCOUNTER — Ambulatory Visit: Payer: Medicare Other | Admitting: Podiatry

## 2022-04-17 ENCOUNTER — Encounter: Payer: Self-pay | Admitting: Podiatry

## 2022-04-17 ENCOUNTER — Ambulatory Visit: Payer: Medicare Other | Admitting: Podiatry

## 2022-04-17 DIAGNOSIS — G5781 Other specified mononeuropathies of right lower limb: Secondary | ICD-10-CM

## 2022-04-17 DIAGNOSIS — G5782 Other specified mononeuropathies of left lower limb: Secondary | ICD-10-CM | POA: Diagnosis not present

## 2022-04-17 NOTE — Progress Notes (Signed)
Paul Hensley presents today states that his neuroma is starting to act up again.  Objective: Vital signs are stable he is alert and oriented x3.  He has palpable pulses and palpable neuromas third interdigital space bilateral.  Assessment: Neuromas second and third interspace bilateral.  Plan: I reinitiated dehydrated alcohol today and I will follow-up with him in 1 month.

## 2022-05-05 ENCOUNTER — Other Ambulatory Visit: Payer: Self-pay | Admitting: Family Medicine

## 2022-05-05 DIAGNOSIS — I10 Essential (primary) hypertension: Secondary | ICD-10-CM

## 2022-05-07 NOTE — Telephone Encounter (Signed)
Requested Prescriptions  Pending Prescriptions Disp Refills  . losartan (COZAAR) 100 MG tablet [Pharmacy Med Name: Losartan Potassium 100 MG Oral Tablet] 90 tablet 0    Sig: Take 1 tablet by mouth once daily     Cardiovascular:  Angiotensin Receptor Blockers Failed - 05/05/2022 10:23 AM      Failed - Cr in normal range and within 180 days    Creat  Date Value Ref Range Status  09/19/2021 1.10 0.70 - 1.35 mg/dL Final         Failed - K in normal range and within 180 days    Potassium  Date Value Ref Range Status  09/19/2021 4.3 3.5 - 5.3 mmol/L Final         Failed - Last BP in normal range    BP Readings from Last 1 Encounters:  09/19/21 (!) 152/72         Failed - Valid encounter within last 6 months    Recent Outpatient Visits          7 months ago Benign essential HTN   [facility] Donita Brooks, MD   1 year ago Benign essential HTN   [facility] Donita Brooks, MD   2 years ago Benign essential HTN   [facility] Donita Brooks, MD   3 years ago Benign essential HTN   [facility] Donita Brooks, MD   3 years ago Benign essential HTN   [facility] Pickard, Priscille Heidelberg, MD             Passed - Patient is not pregnant      . atenolol (TENORMIN) 50 MG tablet [Pharmacy Med Name: Atenolol 50 MG Oral Tablet] 90 tablet 0    Sig: Take 1 tablet by mouth once daily     Cardiovascular: Beta Blockers 2 Failed - 05/05/2022 10:23 AM      Failed - Last BP in normal range    BP Readings from Last 1 Encounters:  09/19/21 (!) 152/72         Failed - Valid encounter within last 6 months    Recent Outpatient Visits          7 months ago Benign essential HTN   [facility] Tanya Nones, Priscille Heidelberg, MD   1 year ago Benign essential HTN   [facility] Donita Brooks, MD   2 years ago Benign essential HTN   [facility] Donita Brooks,  MD   3 years ago Benign essential HTN   [facility] Donita Brooks, MD   3 years ago Benign essential HTN   [facility] Pickard, Priscille Heidelberg, MD             Passed - Cr in normal range and within 360 days    Creat  Date Value Ref Range Status  09/19/2021 1.10 0.70 - 1.35 mg/dL Final         Passed - Last Heart Rate in normal range    Pulse Readings from Last 1 Encounters:  09/19/21 71         . doxazosin (CARDURA) 4 MG tablet [Pharmacy Med Name: Doxazosin Mesylate 4 MG Oral Tablet] 90 tablet 0    Sig: Take 1 tablet by mouth once daily     Cardiovascular:  Alpha Blockers Failed - 05/05/2022 10:23 AM      Failed -  Last BP in normal range    BP Readings from Last 1 Encounters:  09/19/21 (!) 152/72         Failed - Valid encounter within last 6 months    Recent Outpatient Visits          7 months ago Benign essential HTN   [facility], Cammie Mcgee, MD   1 year ago Benign essential HTN   [place] Susy Frizzle, MD   2 years ago Benign essential HTN   [place] Susy Frizzle, MD   3 years ago Benign essential HTN   [place] Susy Frizzle, MD   3 years ago Benign essential HTN   [place] Pickard, Cammie Mcgee, MD

## 2022-05-11 ENCOUNTER — Ambulatory Visit (INDEPENDENT_AMBULATORY_CARE_PROVIDER_SITE_OTHER): Payer: Medicare Other | Admitting: Family Medicine

## 2022-05-11 VITALS — BP 144/86 | HR 63 | Ht 66.0 in | Wt 178.0 lb

## 2022-05-11 DIAGNOSIS — Z23 Encounter for immunization: Secondary | ICD-10-CM | POA: Diagnosis not present

## 2022-05-11 DIAGNOSIS — I10 Essential (primary) hypertension: Secondary | ICD-10-CM | POA: Diagnosis not present

## 2022-05-11 DIAGNOSIS — Z0000 Encounter for general adult medical examination without abnormal findings: Secondary | ICD-10-CM | POA: Diagnosis not present

## 2022-05-11 DIAGNOSIS — Z1211 Encounter for screening for malignant neoplasm of colon: Secondary | ICD-10-CM | POA: Diagnosis not present

## 2022-05-11 NOTE — Addendum Note (Signed)
Addended by: Randal Buba K on: 05/11/2022 09:41 AM   Modules accepted: Orders

## 2022-05-11 NOTE — Progress Notes (Signed)
Subjective:    Patient ID: Paul Hensley, male    DOB: July 05, 1956, 66 y.o.   MRN: [id number]  HPI patient is here today for complete physical exam.  He has not been taking hydrochlorothiazide or lovastatin due to cramps.  The cramps have improved but his blood pressure slightly high.  He denies any chest pain or shortness of breath or dyspnea on exertion.  He is overdue for colonoscopy which he declines that he would consider doing Cologuard.  His PSA was checked in March and was normal at 0.88.  He has had the flu shot.  He has had the shingles vaccine.  He has not had Prevnar 20.  He has not had the most recent COVID booster.  He denies any falls or depression or memory loss Past Medical History:  Diagnosis Date  . GAD (generalized anxiety disorder)   . Hiatal hernia   . High triglycerides   . Hypertension    No past surgical history on file. Current Outpatient Medications on File Prior to Visit  Medication Sig Dispense Refill  . atenolol (TENORMIN) 50 MG tablet Take 1 tablet by mouth once daily 90 tablet 0  . doxazosin (CARDURA) 4 MG tablet Take 1 tablet by mouth once daily 90 tablet 0  . finasteride (PROSCAR) 5 MG tablet Take 1 tablet by mouth once daily 90 tablet 1  . losartan (COZAAR) 100 MG tablet Take 1 tablet by mouth once daily 90 tablet 0  . meloxicam (MOBIC) 15 MG tablet Take 1 tablet (15 mg total) by mouth daily. 90 tablet 1  . hydrochlorothiazide (HYDRODIURIL) 25 MG tablet Take 1 tablet (25 mg total) by mouth daily. (Patient not taking: Reported on 05/11/2022) 90 tablet 3  . lovastatin (MEVACOR) 20 MG tablet TAKE 1 TABLET BY MOUTH ONCE DAILY AT BEDTIME (Patient not taking: Reported on 05/11/2022) 90 tablet 0   No current facility-administered medications on file prior to visit.   Allergies  Allergen Reactions  . Effexor [Venlafaxine Hydrochloride]    Social History   Socioeconomic History  . Marital status: Married    Spouse name: Not on file  . Number of children:  Not on file  . Years of education: Not on file  . Highest education level: Not on file  Occupational History  . Not on file  Tobacco Use  . Smoking status: Former    Types: Cigarettes    Quit date: 10/02/1971    Years since quitting: 50.6  . Smokeless tobacco: Never  Substance and Sexual Activity  . Alcohol use: No  . Drug use: No  . Sexual activity: Not on file  Other Topics Concern  . Not on file  Social History Narrative  . Not on file   Social Determinants of Health   Financial Resource Strain: Not on file  Food Insecurity: Not on file  Transportation Needs: Not on file  Physical Activity: Not on file  Stress: Not on file  Social Connections: Not on file  Intimate Partner Violence: Not on file     Review of Systems  All other systems reviewed and are negative.      Objective:   Physical Exam Vitals reviewed.  Constitutional:      General: He is not in acute distress.    Appearance: Normal appearance. He is well-developed and normal weight. He is not ill-appearing, toxic-appearing or diaphoretic.  HENT:     Head: Normocephalic and atraumatic.     Right Ear: Tympanic membrane and ear  canal normal. There is no impacted cerumen.     Left Ear: Tympanic membrane and ear canal normal. There is no impacted cerumen.     Nose: No congestion or rhinorrhea.     Mouth/Throat:     Mouth: Mucous membranes are moist.     Pharynx: No oropharyngeal exudate or posterior oropharyngeal erythema.  Eyes:     Conjunctiva/sclera: Conjunctivae normal.     Pupils: Pupils are equal, round, and reactive to light.  Cardiovascular:     Rate and Rhythm: Normal rate and regular rhythm.     Pulses: Normal pulses.     Heart sounds: Normal heart sounds. No murmur heard.    No friction rub. No gallop.  Pulmonary:     Effort: Pulmonary effort is normal. No respiratory distress.     Breath sounds: Normal breath sounds. No stridor. No wheezing or rales.  Abdominal:     General: Bowel sounds  are normal. There is no distension.     Palpations: Abdomen is soft. There is no mass.     Tenderness: There is no abdominal tenderness. There is no guarding or rebound.     Hernia: A hernia is present.  Musculoskeletal:        General: No swelling or deformity.     Cervical back: Normal range of motion and neck supple.     Right lower leg: No edema.     Left lower leg: No edema.     Right foot: Normal range of motion. No swelling, deformity, laceration, tenderness, bony tenderness or crepitus.     Left foot: Normal range of motion. No swelling, deformity, laceration, tenderness, bony tenderness or crepitus.  Skin:    Coloration: Skin is not jaundiced.     Findings: No bruising, erythema, lesion or rash.  Neurological:     General: No focal deficit present.     Mental Status: He is alert and oriented to person, place, and time. Mental status is at baseline.     Motor: No weakness.     Gait: Gait normal.     Deep Tendon Reflexes: Reflexes normal.  Psychiatric:        Mood and Affect: Mood normal.        Thought Content: Thought content normal.        Judgment: Judgment normal.     Assessment & Plan:  Colon cancer screening - Plan: Cologuard  Benign essential HTN - Plan: CBC with Differential/Platelet, COMPLETE METABOLIC PANEL WITH GFR, Lipid panel  General medical exam Patient has an umbilical hernia but he declines a referral to surgery to have it fixed.  He received Prevnar 20 today.  Shingrix and his flu shot are up-to-date.  I did make him aware of the COVID booster.  I will schedule the patient for Cologuard as he declines a colonoscopy.  Check CBC CMP and lipid panel.  Blood pressure slightly high today.  Await the results of his lab work to determine the next step of medical therapy.  He denies any falls or depression or memory loss

## 2022-05-12 LAB — LIPID PANEL
Cholesterol: 155 mg/dL (ref <200)
HDL: 44 mg/dL (ref >=40)
LDL Cholesterol (Calc): 95 mg/dL (calc)
Non-HDL Cholesterol (Calc): 111 mg/dL (calc) (ref <130)
Total CHOL/HDL Ratio: 3.5 (calc) (ref <5.0)
Triglycerides: 70 mg/dL (ref <150)

## 2022-05-12 LAB — COMPLETE METABOLIC PANEL WITH GFR
AG Ratio: 1.9 (calc) (ref 1.0–2.5)
ALT: 14 U/L (ref 9–46)
AST: 18 U/L (ref 10–35)
Albumin: 4.2 g/dL (ref 3.6–5.1)
Alkaline phosphatase (APISO): 69 U/L (ref 35–144)
BUN: 11 mg/dL (ref 7–25)
CO2: 27 mmol/L (ref 20–32)
Calcium: 9.1 mg/dL (ref 8.6–10.3)
Chloride: 103 mmol/L (ref 98–110)
Creat: 1.09 mg/dL (ref 0.70–1.35)
Globulin: 2.2 g/dL (calc) (ref 1.9–3.7)
Glucose, Bld: 90 mg/dL (ref 65–99)
Potassium: 4.4 mmol/L (ref 3.5–5.3)
Sodium: 137 mmol/L (ref 135–146)
Total Bilirubin: 0.8 mg/dL (ref 0.2–1.2)
Total Protein: 6.4 g/dL (ref 6.1–8.1)
eGFR: 74 mL/min/{1.73_m2} (ref >=60)

## 2022-05-12 LAB — CBC WITH DIFFERENTIAL/PLATELET
Absolute Monocytes: 432 cells/uL (ref 200–950)
Basophils Absolute: 30 cells/uL (ref 0–200)
Basophils Relative: 0.5 %
Eosinophils Absolute: 180 cells/uL (ref 15–500)
Eosinophils Relative: 3 %
HCT: 44 % (ref 38.5–50.0)
Hemoglobin: 15.3 g/dL (ref 13.2–17.1)
Lymphs Abs: 1356 cells/uL (ref 850–3900)
MCH: 29.7 pg (ref 27.0–33.0)
MCHC: 34.8 g/dL (ref 32.0–36.0)
MCV: 85.3 fL (ref 80.0–100.0)
MPV: 9.6 fL (ref 7.5–12.5)
Monocytes Relative: 7.2 %
Neutro Abs: 4002 cells/uL (ref 1500–7800)
Neutrophils Relative %: 66.7 %
Platelets: 233 10*3/uL (ref 140–400)
RBC: 5.16 10*6/uL (ref 4.20–5.80)
RDW: 12.3 % (ref 11.0–15.0)
Total Lymphocyte: 22.6 %
WBC: 6 10*3/uL (ref 3.8–10.8)

## 2022-05-22 ENCOUNTER — Encounter: Payer: Self-pay | Admitting: Podiatry

## 2022-05-22 ENCOUNTER — Ambulatory Visit: Payer: Medicare Other | Admitting: Podiatry

## 2022-05-22 DIAGNOSIS — G5782 Other specified mononeuropathies of left lower limb: Secondary | ICD-10-CM

## 2022-05-22 DIAGNOSIS — G5781 Other specified mononeuropathies of right lower limb: Secondary | ICD-10-CM

## 2022-05-22 NOTE — Progress Notes (Signed)
He presents today chief complaint of painfully stiff toes with neuromas bilaterally.  States that it comes and goes I think I need to retire so the metatarsal base are stiff.  Objective: Vital signs are stable alert and oriented x3.  Pulses are palpable he still has palpable Mulder's click to the third interdigital space bilaterally as well as the second and digital space.  Assessment: Neuroma second and third interdigital space bilateral.  Plan: Injected bilateral second and third interdigital spaces with 1 cc dehydrated alcohol to each space.  Follow-up with him in 6 weeks

## 2022-06-08 DIAGNOSIS — Z1211 Encounter for screening for malignant neoplasm of colon: Secondary | ICD-10-CM | POA: Diagnosis not present

## 2022-06-21 LAB — COLOGUARD: COLOGUARD: NEGATIVE

## 2022-07-04 ENCOUNTER — Other Ambulatory Visit: Payer: Self-pay | Admitting: Family Medicine

## 2022-07-05 ENCOUNTER — Ambulatory Visit: Payer: Medicare Other | Admitting: Podiatry

## 2022-07-12 ENCOUNTER — Ambulatory Visit: Payer: Medicare Other | Admitting: Podiatry

## 2022-07-12 VITALS — BP 198/107 | HR 85

## 2022-07-12 DIAGNOSIS — M778 Other enthesopathies, not elsewhere classified: Secondary | ICD-10-CM

## 2022-07-12 MED ORDER — METHYLPREDNISOLONE 4 MG PO TBPK: ORAL_TABLET | ORAL | 0 refills | Status: DC

## 2022-07-12 MED ORDER — TRIAMCINOLONE ACETONIDE 40 MG/ML IJ SUSP
40.0000 mg | Freq: Once | INTRAMUSCULAR | Status: AC
  Administered 2022-07-12: 40 mg

## 2022-07-15 NOTE — Progress Notes (Signed)
He presents today for follow-up of neuroma third interspace bilaterally.  He states that there really is conestat of the neuroma I think we are feeling considerably better.  Objective: Vital signs stable alert oriented x 3 he has stiffness on range of motion of his toes states he has a lot of cramps in his feet and his legs.  Continues to drink his electrolyte supplements daily.  Assessment capsulitis dorsal aspect of the bilateral forefoot.  Plan: I injected dorsal aspect of bilateral forefoot today at the level of the metatarsophalangeal joints with 20 g Kenalog femoris Marcaine bilateral foot.  Tolerated procedure well follow-up with him in about 2 months or so.

## 2022-07-24 DIAGNOSIS — K080 Exfoliation of teeth due to systemic causes: Secondary | ICD-10-CM | POA: Diagnosis not present

## 2022-07-27 ENCOUNTER — Ambulatory Visit (INDEPENDENT_AMBULATORY_CARE_PROVIDER_SITE_OTHER): Payer: Medicare Other | Admitting: Family Medicine

## 2022-07-27 VITALS — BP 162/82 | HR 64 | Temp 97.6°F | Ht 66.0 in | Wt 177.2 lb

## 2022-07-27 DIAGNOSIS — K130 Diseases of lips: Secondary | ICD-10-CM

## 2022-07-27 NOTE — Progress Notes (Signed)
Subjective:    Patient ID: Paul Hensley, male    DOB: 11/24/1955, 67 y.o.   MRN: [id number]  Mouth Lesions  Associated symptoms include mouth sores.     Patient reports redness and irritation at the corners of his mouth as shown above in the photograph.  It burns.  He tried putting cortisone cream on it but it burned and hurt.  It hurts when he eats any food with salt.  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  Therefore the corners of his mouth stay moist Past Medical History:  Diagnosis Date  . GAD (generalized anxiety disorder)   . Hiatal hernia   . High triglycerides   . Hypertension    No past surgical history on file. Current Outpatient Medications on File Prior to Visit  Medication Sig Dispense Refill  . atenolol (TENORMIN) 50 MG tablet Take 1 tablet by mouth once daily 90 tablet 0  . doxazosin (CARDURA) 4 MG tablet Take 1 tablet by mouth once daily 90 tablet 0  . finasteride (PROSCAR) 5 MG tablet Take 1 tablet by mouth once daily 90 tablet 1  . losartan (COZAAR) 100 MG tablet Take 1 tablet by mouth once daily 90 tablet 0  . meloxicam (MOBIC) 15 MG tablet Take 1 tablet by mouth once daily 90 tablet 0   No current facility-administered medications on file prior to visit.   Allergies  Allergen Reactions  . Effexor [Venlafaxine Hydrochloride]    Social History   Socioeconomic History  . Marital status: Married    Spouse name: Not on file  . Number of children: Not on file  . Years of education: Not on file  . Highest education level: Not on file  Occupational History  . Not on file  Tobacco Use  . Smoking status: Former    Types: Cigarettes    Quit date: 10/02/1971    Years since quitting: 50.8  . Smokeless tobacco: Never  Substance and Sexual Activity  . Alcohol use: No  . Drug use: No  . Sexual activity: Not on file  Other Topics Concern  . Not on file  Social History Narrative  . Not on file   Social  Determinants of Health   Financial Resource Strain: Not on file  Food Insecurity: Not on file  Transportation Needs: Not on file  Physical Activity: Not on file  Stress: Not on file  Social Connections: Not on file  Intimate Partner Violence: Not on file     Review of Systems  HENT:  Positive for mouth sores.   All other systems reviewed and are negative.      Objective:   Physical Exam Vitals reviewed.  Constitutional:      General: He is not in acute distress.    Appearance: He is well-developed. He is not diaphoretic.  HENT:     Mouth/Throat:   Cardiovascular:     Rate and Rhythm: Normal rate and regular rhythm.     Heart sounds: Normal heart sounds. No murmur heard.    No friction rub. No gallop.  Pulmonary:     Effort: Pulmonary effort is normal. No respiratory distress.     Breath sounds: Normal breath sounds. No stridor. No wheezing or rales.  Abdominal:     General: Bowel sounds are normal. There is no distension.     Palpations: Abdomen is soft. There is no mass.     Tenderness:  There is no abdominal tenderness. There is no guarding.  Musculoskeletal:     Right foot: No swelling, laceration, tenderness, bony tenderness or crepitus.     Left foot: No swelling, laceration, tenderness, bony tenderness or crepitus.  Skin:    Findings: Erythema and rash present.     Assessment & Plan:  Angular cheilitis I believe this is due to irritation from the moisture.  I recommended putting clotrimazole cream in the morning before he goes to work in the affected area.  While he is at work throughout the day I want him putting petroleum jelly on the affected area 2-3 times during the day to act as a barrier cream to keep the drooling and saliva from irritating the skin.  At night I want him to put the clotrimazole cream back on the affected area.  Do this for a week and I feel that the rash will likely improve

## 2022-08-05 ENCOUNTER — Other Ambulatory Visit: Payer: Self-pay | Admitting: Family Medicine

## 2022-08-05 DIAGNOSIS — I10 Essential (primary) hypertension: Secondary | ICD-10-CM

## 2022-08-06 NOTE — Telephone Encounter (Signed)
OV 05/11/22 Requested Prescriptions  Pending Prescriptions Disp Refills   losartan (COZAAR) 100 MG tablet [Pharmacy Med Name: Losartan Potassium 100 MG Oral Tablet] 90 tablet 0    Sig: Take 1 tablet by mouth once daily     Cardiovascular:  Angiotensin Receptor Blockers Failed - 08/05/2022 10:36 AM      Failed - Last BP in normal range    BP Readings from Last 1 Encounters:  07/27/22 (!) 162/82         Failed - Valid encounter within last 6 months    Recent Outpatient Visits           10 months ago Benign essential HTN   [place] Susy Frizzle, MD   1 year ago Benign essential HTN   [place] Susy Frizzle, MD   2 years ago Benign essential HTN   [place] Susy Frizzle, MD   3 years ago Benign essential HTN   [place] Dennard Schaumann, Cammie Mcgee, MD   3 years ago Benign essential HTN   [place] Pickard, Cammie Mcgee, MD              Passed - Cr in normal range and within 180 days    Creat  Date Value Ref Range Status  05/11/2022 1.09 0.70 - 1.35 mg/dL Final         Passed - K in normal range and within 180 days    Potassium  Date Value Ref Range Status  05/11/2022 4.4 3.5 - 5.3 mmol/L Final         Passed - Patient is not pregnant       atenolol (TENORMIN) 50 MG tablet [Pharmacy Med Name: Atenolol 50 MG Oral Tablet] 90 tablet 0    Sig: Take 1 tablet by mouth once daily     Cardiovascular: Beta Blockers 2 Failed - 08/05/2022 10:36 AM      Failed - Last BP in normal range    BP Readings from Last 1 Encounters:  07/27/22 (!) 162/82         Failed - Valid encounter within last 6 months    Recent Outpatient Visits           10 months ago Benign essential HTN   [place] Dennard Schaumann, Cammie Mcgee, MD   1 year ago Benign essential HTN   [place] Dennard Schaumann, Cammie Mcgee, MD   2 years ago Benign essential HTN   [place]  Dennard Schaumann, Cammie Mcgee, MD   3 years ago Benign essential HTN   [place] Susy Frizzle, MD   3 years ago Benign essential HTN   [place] Pickard, Cammie Mcgee, MD              Passed - Cr in normal range and within 360 days    Creat  Date Value Ref Range Status  05/11/2022 1.09 0.70 - 1.35 mg/dL Final         Passed - Last Heart Rate in normal range    Pulse Readings from Last 1 Encounters:  07/27/22 64          doxazosin (CARDURA) 4 MG tablet [Pharmacy Med Name: Doxazosin Mesylate 4 MG Oral Tablet] 90 tablet 0    Sig: Take 1 tablet by mouth once daily     Cardiovascular:  Alpha Blockers Failed - 08/05/2022 10:36 AM  Failed - Last BP in normal range    BP Readings from Last 1 Encounters:  07/27/22 (!) 162/82         Failed - Valid encounter within last 6 months    Recent Outpatient Visits           10 months ago Benign essential HTN   [place] Dennard Schaumann, Cammie Mcgee, MD   1 year ago Benign essential HTN   [place] Susy Frizzle, MD   2 years ago Benign essential HTN   [place] Susy Frizzle, MD   3 years ago Benign essential HTN   [place] Susy Frizzle, MD   3 years ago Benign essential HTN   [place] Pickard, Cammie Mcgee, MD

## 2022-09-09 ENCOUNTER — Other Ambulatory Visit: Payer: Self-pay | Admitting: Family Medicine

## 2022-09-09 DIAGNOSIS — I10 Essential (primary) hypertension: Secondary | ICD-10-CM

## 2022-09-18 ENCOUNTER — Encounter: Payer: Self-pay | Admitting: Podiatry

## 2022-09-18 ENCOUNTER — Ambulatory Visit: Payer: Medicare Other | Admitting: Podiatry

## 2022-09-18 DIAGNOSIS — G5782 Other specified mononeuropathies of left lower limb: Secondary | ICD-10-CM

## 2022-09-18 DIAGNOSIS — G5781 Other specified mononeuropathies of right lower limb: Secondary | ICD-10-CM | POA: Diagnosis not present

## 2022-09-18 NOTE — Progress Notes (Signed)
He presents today for follow-up of his neuritis neuromas bilaterally.  He states that they are always improving but by the end of the day they does get stiff and tight.  Objective: Vital signs are stable alert oriented x 3 there is no erythema edema salines drainage odor no reproducible pain.  Assessment: Neuromas bilateral and nontender.  Plan: Follow-up with me on an as-needed basis or in 6 weeks.

## 2022-11-05 ENCOUNTER — Other Ambulatory Visit: Payer: Self-pay | Admitting: Family Medicine

## 2022-11-14 DIAGNOSIS — K080 Exfoliation of teeth due to systemic causes: Secondary | ICD-10-CM | POA: Diagnosis not present

## 2022-11-23 ENCOUNTER — Other Ambulatory Visit: Payer: Self-pay | Admitting: Family Medicine

## 2022-11-23 ENCOUNTER — Ambulatory Visit: Payer: Medicare Other | Admitting: Family Medicine

## 2022-11-23 NOTE — Telephone Encounter (Signed)
Requested Prescriptions  Pending Prescriptions Disp Refills   meloxicam (MOBIC) 15 MG tablet [Pharmacy Med Name: Meloxicam 15 MG Oral Tablet] 90 tablet 0    Sig: Take 1 tablet by mouth once daily     Analgesics:  COX2 Inhibitors Failed - 11/23/2022  1:00 PM      Failed - Manual Review: Labs are only required if the patient has taken medication for more than 8 weeks.      Failed - Valid encounter within last 12 months    Recent Outpatient Visits           1 year ago Benign essential HTN   [facility] Pickard, Priscille Heidelberg, MD   2 years ago Benign essential HTN   [facility] Tanya Nones, Priscille Heidelberg, MD   2 years ago Benign essential HTN   [facility] Tanya Nones, Priscille Heidelberg, MD   3 years ago Benign essential HTN   [facility] Tanya Nones, Priscille Heidelberg, MD   4 years ago Benign essential HTN   [facility] Pickard, Priscille Heidelberg, MD              Passed - HGB in normal range and within 360 days    Hemoglobin  Date Value Ref Range Status  05/11/2022 15.3 13.2 - 17.1 g/dL Final         Passed - Cr in normal range and within 360 days    Creat  Date Value Ref Range Status  05/11/2022 1.09 0.70 - 1.35 mg/dL Final         Passed - HCT in normal range and within 360 days    HCT  Date Value Ref Range Status  05/11/2022 44.0 38.5 - 50.0 % Final         Passed - AST in normal range and within 360 days    AST  Date Value Ref Range Status  05/11/2022 18 10 - 35 U/L Final         Passed - ALT in normal range and within 360 days    ALT  Date Value Ref Range Status  05/11/2022 14 9 - 46 U/L Final         Passed - eGFR is 30 or above and within 360 days    GFR, Est African American  Date Value Ref Range Status  04/08/2020 77 > OR = 60 mL/min/1.61m2 Final   GFR, Est Non African American  Date Value Ref Range Status  04/08/2020 66 > OR = 60 mL/min/1.83m2 Final   eGFR  Date Value Ref Range Status  05/11/2022 74 > OR =  60 mL/min/1.35m2 Final         Passed - Patient is not pregnant

## 2022-12-13 ENCOUNTER — Ambulatory Visit: Payer: Medicare Other | Admitting: Podiatry

## 2022-12-13 ENCOUNTER — Encounter: Payer: Self-pay | Admitting: Podiatry

## 2022-12-13 DIAGNOSIS — G5782 Other specified mononeuropathies of left lower limb: Secondary | ICD-10-CM

## 2022-12-13 DIAGNOSIS — G5781 Other specified mononeuropathies of right lower limb: Secondary | ICD-10-CM | POA: Diagnosis not present

## 2022-12-13 MED ORDER — TRIAMCINOLONE ACETONIDE 40 MG/ML IJ SUSP
40.0000 mg | Freq: Once | INTRAMUSCULAR | Status: AC
Start: 2022-12-13 — End: 2022-12-13
  Administered 2022-12-13: 40 mg

## 2022-12-16 NOTE — Progress Notes (Signed)
He presents today for follow-up of his neuroma to the second and third interspace bilateral foot.  Objective: Vital signs stable alert oriented x 3 pulses are palpable.  No open lesions or wounds are noted.  He does have pain on palpation to the second interdigital space as well as 30 joint space with Morton's neuroma type symptomatology i.e. Mulder's click.  Assessment: Neuritis capsulitis.  Plan: Injected the second and third interdigital spaces today with Kenalog and local anesthetic follow-up with him on an as-needed basis.

## 2023-01-18 ENCOUNTER — Ambulatory Visit (INDEPENDENT_AMBULATORY_CARE_PROVIDER_SITE_OTHER): Payer: Medicare Other | Admitting: Family Medicine

## 2023-01-18 ENCOUNTER — Encounter: Payer: Self-pay | Admitting: Family Medicine

## 2023-01-18 VITALS — BP 146/82 | HR 70 | Temp 97.5°F | Ht 66.0 in | Wt 168.6 lb

## 2023-01-18 DIAGNOSIS — L821 Other seborrheic keratosis: Secondary | ICD-10-CM

## 2023-01-18 NOTE — Progress Notes (Signed)
Subjective:    Patient ID: Paul Hensley, male    DOB: 1956-06-12, 67 y.o.   MRN: [id number]  Patient request 3 moles to be treated.  He has a seborrheic keratoses on his superior right deltoid.  It is about 5 mm in diameter.  It is a warty brown papule with sharp borders.  He has a 4 mm sessile seborrheic keratosis directly over his manubrium.  He also has a 5 mm seborrheic keratosis on the posterior aspect of his left ultimately.  He would like all 3 of these lesions treated Past Medical History:  Diagnosis Date   GAD (generalized anxiety disorder)    Hiatal hernia    High triglycerides    Hypertension    No past surgical history on file. Current Outpatient Medications on File Prior to Visit  Medication Sig Dispense Refill   atenolol (TENORMIN) 50 MG tablet Take 1 tablet by mouth once daily 90 tablet 0   doxazosin (CARDURA) 4 MG tablet Take 1 tablet by mouth once daily 90 tablet 0   finasteride (PROSCAR) 5 MG tablet Take 1 tablet by mouth once daily 90 tablet 1   losartan (COZAAR) 100 MG tablet Take 1 tablet by mouth once daily 90 tablet 0   meloxicam (MOBIC) 15 MG tablet Take 1 tablet by mouth once daily 90 tablet 0   No current facility-administered medications on file prior to visit.   Allergies  Allergen Reactions   Effexor [Venlafaxine Hydrochloride]    Social History   Socioeconomic History   Marital status: Married    Spouse name: Not on file   Number of children: Not on file   Years of education: Not on file   Highest education level: Not on file  Occupational History   Not on file  Tobacco Use   Smoking status: Former    Current packs/day: 0.00    Types: Cigarettes    Quit date: 10/02/1971    Years since quitting: 51.3   Smokeless tobacco: Never  Substance and Sexual Activity   Alcohol use: No   Drug use: No   Sexual activity: Not on file  Other Topics Concern   Not on file  Social History Narrative   Not on file   Social Determinants of Health    Financial Resource Strain: Not on file  Food Insecurity: Not on file  Transportation Needs: Not on file  Physical Activity: Not on file  Stress: Not on file  Social Connections: Not on file  Intimate Partner Violence: Not on file     Review of Systems  All other systems reviewed and are negative.      Objective:   Physical Exam Vitals reviewed.  Constitutional:      General: He is not in acute distress.    Appearance: He is well-developed. He is not diaphoretic.  Cardiovascular:     Rate and Rhythm: Normal rate and regular rhythm.     Heart sounds: Normal heart sounds. No murmur heard.    No friction rub. No gallop.  Pulmonary:     Effort: Pulmonary effort is normal. No respiratory distress.     Breath sounds: Normal breath sounds. No stridor. No wheezing or rales.  Musculoskeletal:     Right foot: No swelling, laceration, tenderness, bony tenderness or crepitus.     Left foot: No swelling, laceration, tenderness, bony tenderness or crepitus.  Skin:    Findings: Lesion present.           Assessment &  Plan:  Seborrheic keratoses Each of the 3 lesions was treated with liquid nitrogen cryotherapy for a total of 30 seconds each.  The patient tolerated the procedure well without complication

## 2023-01-19 ENCOUNTER — Other Ambulatory Visit: Payer: Self-pay | Admitting: Family Medicine

## 2023-01-21 ENCOUNTER — Other Ambulatory Visit: Payer: Self-pay | Admitting: Family Medicine

## 2023-01-21 NOTE — Telephone Encounter (Signed)
Requested Prescriptions  Refused Prescriptions Disp Refills   lovastatin (MEVACOR) 20 MG tablet [Pharmacy Med Name: Lovastatin 20 MG Oral Tablet] 90 tablet 0    Sig: TAKE 1 TABLET BY MOUTH ONCE DAILY AT BEDTIME     Cardiovascular:  Antilipid - Statins 2 Failed - 01/19/2023  9:12 AM      Failed - Valid encounter within last 12 months    Recent Outpatient Visits           1 year ago Benign essential HTN   [facility] Donita Brooks, MD   2 years ago Benign essential HTN   [facility] Donita Brooks, MD   2 years ago Benign essential HTN   [facility] Tanya Nones, Priscille Heidelberg, MD   4 years ago Benign essential HTN   [facility] Tanya Nones, Priscille Heidelberg, MD   4 years ago Benign essential HTN   [facility] Tanya Nones, Priscille Heidelberg, MD              Failed - Lipid Panel in normal range within the last 12 months    Cholesterol  Date Value Ref Range Status  05/11/2022 155 <200 mg/dL Final   LDL Cholesterol (Calc)  Date Value Ref Range Status  05/11/2022 95 mg/dL (calc) Final    Comment:    Reference range: <100 . Desirable range <100 mg/dL for primary prevention;   <70 mg/dL for patients with CHD or diabetic patients  with > or = 2 CHD risk factors. Marland Kitchen LDL-C is now calculated using the Martin-Hopkins  calculation, which is a validated novel method providing  better accuracy than the Friedewald equation in the  estimation of LDL-C.  Horald Pollen et al. Lenox Ahr. 5176;160(73): 2061-2068  (http://education.[facility].com/faq/FAQ164)    HDL  Date Value Ref Range Status  05/11/2022 44 > OR = 40 mg/dL Final   Triglycerides  Date Value Ref Range Status  05/11/2022 70 <150 mg/dL Final         Passed - Cr in normal range and within 360 days    Creat  Date Value Ref Range Status  05/11/2022 1.09 0.70 - 1.35 mg/dL Final         Passed - Patient is not pregnant

## 2023-01-22 NOTE — Telephone Encounter (Signed)
Requested Prescriptions  Refused Prescriptions Disp Refills   lovastatin (MEVACOR) 20 MG tablet [Pharmacy Med Name: Lovastatin 20 MG Oral Tablet] 90 tablet 0    Sig: TAKE 1 TABLET BY MOUTH ONCE DAILY AT BEDTIME     Cardiovascular:  Antilipid - Statins 2 Failed - 01/21/2023  5:43 PM      Failed - Valid encounter within last 12 months    Recent Outpatient Visits           1 year ago Benign essential HTN   [facility] Donita Brooks, MD   2 years ago Benign essential HTN   [facility] Donita Brooks, MD   2 years ago Benign essential HTN   [facility] Tanya Nones, Priscille Heidelberg, MD   4 years ago Benign essential HTN   [facility] Tanya Nones, Priscille Heidelberg, MD   4 years ago Benign essential HTN   [facility] Tanya Nones, Priscille Heidelberg, MD              Failed - Lipid Panel in normal range within the last 12 months    Cholesterol  Date Value Ref Range Status  05/11/2022 155 <200 mg/dL Final   LDL Cholesterol (Calc)  Date Value Ref Range Status  05/11/2022 95 mg/dL (calc) Final    Comment:    Reference range: <100 . Desirable range <100 mg/dL for primary prevention;   <70 mg/dL for patients with CHD or diabetic patients  with > or = 2 CHD risk factors. Marland Kitchen LDL-C is now calculated using the Martin-Hopkins  calculation, which is a validated novel method providing  better accuracy than the Friedewald equation in the  estimation of LDL-C.  Horald Pollen et al. Lenox Ahr. 1027;253(66): 2061-2068  (http://education.[facility].com/faq/FAQ164)    HDL  Date Value Ref Range Status  05/11/2022 44 > OR = 40 mg/dL Final   Triglycerides  Date Value Ref Range Status  05/11/2022 70 <150 mg/dL Final         Passed - Cr in normal range and within 360 days    Creat  Date Value Ref Range Status  05/11/2022 1.09 0.70 - 1.35 mg/dL Final         Passed - Patient is not pregnant

## 2023-01-27 ENCOUNTER — Other Ambulatory Visit: Payer: Self-pay | Admitting: Family Medicine

## 2023-02-09 ENCOUNTER — Other Ambulatory Visit: Payer: Self-pay | Admitting: Family Medicine

## 2023-02-09 DIAGNOSIS — I10 Essential (primary) hypertension: Secondary | ICD-10-CM

## 2023-02-18 NOTE — Telephone Encounter (Signed)
Requested Prescriptions  Pending Prescriptions Disp Refills   atenolol (TENORMIN) 50 MG tablet [Pharmacy Med Name: Atenolol 50 MG Oral Tablet] 90 tablet 0    Sig: Take 1 tablet (50 mg total) by mouth daily. OFFICE VISIT NEEDED FOR ADDITIONAL REFILLS     Cardiovascular: Beta Blockers 2 Failed - 02/18/2023  3:31 PM      Failed - Last BP in normal range    BP Readings from Last 1 Encounters:  01/18/23 (!) 146/82         Failed - Valid encounter within last 6 months    Recent Outpatient Visits           1 year ago Benign essential HTN   [facility] Donita Brooks, MD   2 years ago Benign essential HTN   [facility] Donita Brooks, MD   2 years ago Benign essential HTN   [facility] Donita Brooks, MD   4 years ago Benign essential HTN   [facility] Donita Brooks, MD   4 years ago Benign essential HTN   [facility] Pickard, Priscille Heidelberg, MD              Passed - Cr in normal range and within 360 days    Creat  Date Value Ref Range Status  05/11/2022 1.09 0.70 - 1.35 mg/dL Final         Passed - Last Heart Rate in normal range    Pulse Readings from Last 1 Encounters:  01/18/23 70

## 2023-02-18 NOTE — Telephone Encounter (Signed)
Received a request for an update on this refill.

## 2023-03-02 ENCOUNTER — Ambulatory Visit (HOSPITAL_COMMUNITY): Payer: Self-pay

## 2023-03-08 DIAGNOSIS — H9193 Unspecified hearing loss, bilateral: Secondary | ICD-10-CM | POA: Diagnosis not present

## 2023-03-08 DIAGNOSIS — H9313 Tinnitus, bilateral: Secondary | ICD-10-CM | POA: Insufficient documentation

## 2023-03-08 DIAGNOSIS — H903 Sensorineural hearing loss, bilateral: Secondary | ICD-10-CM | POA: Insufficient documentation

## 2023-04-26 ENCOUNTER — Encounter: Payer: Self-pay | Admitting: Family Medicine

## 2023-04-26 ENCOUNTER — Ambulatory Visit (INDEPENDENT_AMBULATORY_CARE_PROVIDER_SITE_OTHER): Payer: Medicare Other | Admitting: Family Medicine

## 2023-04-26 VITALS — BP 138/72 | HR 70 | Temp 97.7°F | Ht 66.0 in | Wt 172.6 lb

## 2023-04-26 DIAGNOSIS — Z125 Encounter for screening for malignant neoplasm of prostate: Secondary | ICD-10-CM

## 2023-04-26 DIAGNOSIS — I10 Essential (primary) hypertension: Secondary | ICD-10-CM | POA: Diagnosis not present

## 2023-04-26 DIAGNOSIS — Z0000 Encounter for general adult medical examination without abnormal findings: Secondary | ICD-10-CM

## 2023-04-26 DIAGNOSIS — Z0001 Encounter for general adult medical examination with abnormal findings: Secondary | ICD-10-CM

## 2023-04-26 DIAGNOSIS — Z23 Encounter for immunization: Secondary | ICD-10-CM | POA: Diagnosis not present

## 2023-04-26 MED ORDER — TRAZODONE HCL 50 MG PO TABS: 50.0000 mg | ORAL_TABLET | Freq: Every evening | ORAL | 1 refills | Status: DC | PRN

## 2023-04-26 NOTE — Progress Notes (Signed)
Subjective:    Patient ID: Paul Hensley, male    DOB: May 05, 1956, 67 y.o.   MRN: [id number]  HPI  Patient is here today for his annual wellness visit.  Patient is up-to-date on his colon cancer screening.  He had Cologuard last year and is not due again until 2026.  He is due for prostate cancer screening and he agrees to receive a PSA today.  He is also due for a flu shot.  The remainder of his vaccinations are up-to-date except for a tetanus shot.  He declines a tetanus shot today.  His blood pressure today is excellent.  His biggest concern is tinnitus.  He is already seen an ear nose and throat physician and was referred to an audiologist.  They explained to the patient that he has sensorineural hearing loss and this is causing the tinnitus.  I tried to explain to the patient the etiology.  I also explained to the patient that medications do not stop this.  He needs to wear hearing protection to keep it from getting worse.  He can use hearing aids to try to drown out the background noise, we can give him medications to help him sleep.  He is interested in trying something at night to help him sleep.  Often he has a difficult time falling asleep due to the noise and the ringing in his ears.  He also reports overactive bladder with frequent nocturia. Past Medical History:  Diagnosis Date   GAD (generalized anxiety disorder)    Hiatal hernia    High triglycerides    Hypertension    No past surgical history on file. Current Outpatient Medications on File Prior to Visit  Medication Sig Dispense Refill   atenolol (TENORMIN) 50 MG tablet Take 1 tablet (50 mg total) by mouth daily. OFFICE VISIT NEEDED FOR ADDITIONAL REFILLS 90 tablet 0   doxazosin (CARDURA) 4 MG tablet Take 1 tablet by mouth once daily 90 tablet 0   finasteride (PROSCAR) 5 MG tablet Take 1 tablet by mouth once daily 90 tablet 1   losartan (COZAAR) 100 MG tablet Take 1 tablet by mouth once daily 90 tablet 0   meloxicam (MOBIC) 15  MG tablet Take 1 tablet by mouth once daily 90 tablet 0   No current facility-administered medications on file prior to visit.   Allergies  Allergen Reactions   Effexor [Venlafaxine Hydrochloride]    Social History   Socioeconomic History   Marital status: Married    Spouse name: Not on file   Number of children: Not on file   Years of education: Not on file   Highest education level: Not on file  Occupational History   Occupation: Retired 04/05/2023  Tobacco Use   Smoking status: Former    Current packs/day: 0.00    Types: Cigarettes    Quit date: 10/02/1971    Years since quitting: 51.6   Smokeless tobacco: Never  Vaping Use   Vaping status: Never Used  Substance and Sexual Activity   Alcohol use: No   Drug use: No   Sexual activity: Not Currently  Other Topics Concern   Not on file  Social History Narrative   Not on file   Social Determinants of Health   Financial Resource Strain: Low Risk  (04/26/2023)   Overall Financial Resource Strain (CARDIA)    Difficulty of Paying Living Expenses: Not very hard  Food Insecurity: No Food Insecurity (04/26/2023)   Hunger Vital Sign  Worried About Programme researcher, broadcasting/film/video in the Last Year: Never true    Ran Out of Food in the Last Year: Never true  Transportation Needs: No Transportation Needs (04/26/2023)   PRAPARE - Administrator, Civil Service (Medical): No    Lack of Transportation (Non-Medical): No  Physical Activity: Sufficiently Active (04/26/2023)   Exercise Vital Sign    Days of Exercise per Week: 3 days    Minutes of Exercise per Session: 60 min  Stress: Not on file  Social Connections: Socially Integrated (04/26/2023)   Social Connection and Isolation Panel [NHANES]    Frequency of Communication with Friends and Family: More than three times a week    Frequency of Social Gatherings with Friends and Family: More than three times a week    Attends Religious Services: More than 4 times per year     Active Member of [employer] or Organizations: Yes    Attends Banker Meetings: More than 4 times per year    Marital Status: Married  Catering manager Violence: Not At Risk (04/26/2023)   Humiliation, Afraid, Rape, and Kick questionnaire    Fear of Current or Ex-Partner: No    Emotionally Abused: No    Physically Abused: No    Sexually Abused: No   No family history on file.   Past Medical History:  Diagnosis Date   GAD (generalized anxiety disorder)    Hiatal hernia    High triglycerides    Hypertension    No past surgical history on file. Current Outpatient Medications on File Prior to Visit  Medication Sig Dispense Refill   atenolol (TENORMIN) 50 MG tablet Take 1 tablet (50 mg total) by mouth daily. OFFICE VISIT NEEDED FOR ADDITIONAL REFILLS 90 tablet 0   doxazosin (CARDURA) 4 MG tablet Take 1 tablet by mouth once daily 90 tablet 0   finasteride (PROSCAR) 5 MG tablet Take 1 tablet by mouth once daily 90 tablet 1   losartan (COZAAR) 100 MG tablet Take 1 tablet by mouth once daily 90 tablet 0   meloxicam (MOBIC) 15 MG tablet Take 1 tablet by mouth once daily 90 tablet 0   No current facility-administered medications on file prior to visit.   Allergies  Allergen Reactions   Effexor [Venlafaxine Hydrochloride]    Social History   Socioeconomic History   Marital status: Married    Spouse name: Not on file   Number of children: Not on file   Years of education: Not on file   Highest education level: Not on file  Occupational History   Occupation: Retired 04/05/2023  Tobacco Use   Smoking status: Former    Current packs/day: 0.00    Types: Cigarettes    Quit date: 10/02/1971    Years since quitting: 51.6   Smokeless tobacco: Never  Vaping Use   Vaping status: Never Used  Substance and Sexual Activity   Alcohol use: No   Drug use: No   Sexual activity: Not Currently  Other Topics Concern   Not on file  Social History Narrative   Not on file   Social  Determinants of Health   Financial Resource Strain: Low Risk  (04/26/2023)   Overall Financial Resource Strain (CARDIA)    Difficulty of Paying Living Expenses: Not very hard  Food Insecurity: No Food Insecurity (04/26/2023)   Hunger Vital Sign    Worried About Running Out of Food in the Last Year: Never true    Ran Out of  Food in the Last Year: Never true  Transportation Needs: No Transportation Needs (04/26/2023)   PRAPARE - Administrator, Civil Service (Medical): No    Lack of Transportation (Non-Medical): No  Physical Activity: Sufficiently Active (04/26/2023)   Exercise Vital Sign    Days of Exercise per Week: 3 days    Minutes of Exercise per Session: 60 min  Stress: Not on file  Social Connections: Socially Integrated (04/26/2023)   Social Connection and Isolation Panel [NHANES]    Frequency of Communication with Friends and Family: More than three times a week    Frequency of Social Gatherings with Friends and Family: More than three times a week    Attends Religious Services: More than 4 times per year    Active Member of [employer] or Organizations: Yes    Attends Banker Meetings: More than 4 times per year    Marital Status: Married  Catering manager Violence: Not At Risk (04/26/2023)   Humiliation, Afraid, Rape, and Kick questionnaire    Fear of Current or Ex-Partner: No    Emotionally Abused: No    Physically Abused: No    Sexually Abused: No     Review of Systems  All other systems reviewed and are negative.      Objective:   Physical Exam Vitals reviewed.  Constitutional:      General: He is not in acute distress.    Appearance: Normal appearance. He is well-developed and normal weight. He is not ill-appearing, toxic-appearing or diaphoretic.  HENT:     Head: Normocephalic and atraumatic.     Right Ear: Tympanic membrane and ear canal normal. There is no impacted cerumen.     Left Ear: Tympanic membrane and ear canal normal. There  is no impacted cerumen.     Nose: Nose normal. No congestion or rhinorrhea.     Mouth/Throat:     Mouth: Mucous membranes are moist.     Pharynx: Oropharynx is clear. No oropharyngeal exudate or posterior oropharyngeal erythema.  Eyes:     Extraocular Movements: Extraocular movements intact.     Conjunctiva/sclera: Conjunctivae normal.     Pupils: Pupils are equal, round, and reactive to light.  Neck:     Vascular: No carotid bruit.  Cardiovascular:     Rate and Rhythm: Normal rate and regular rhythm.     Heart sounds: Normal heart sounds. No murmur heard.    No friction rub. No gallop.  Pulmonary:     Effort: Pulmonary effort is normal. No respiratory distress.     Breath sounds: Normal breath sounds. No stridor. No wheezing, rhonchi or rales.  Chest:     Chest wall: No tenderness.  Abdominal:     General: Bowel sounds are normal. There is no distension.     Palpations: Abdomen is soft. There is no mass.     Tenderness: There is no abdominal tenderness. There is no guarding or rebound.     Hernia: A hernia (umbilical) is present.  Musculoskeletal:     Cervical back: Normal range of motion.     Right lower leg: No edema.     Left lower leg: No edema.     Right foot: Normal range of motion. No swelling, deformity, laceration, tenderness, bony tenderness or crepitus.     Left foot: Normal range of motion. No swelling, deformity, laceration, tenderness, bony tenderness or crepitus.  Lymphadenopathy:     Cervical: No cervical adenopathy.  Skin:    Coloration:  Skin is not jaundiced or pale.     Findings: No bruising, erythema, lesion or rash.  Neurological:     General: No focal deficit present.     Mental Status: He is alert and oriented to person, place, and time. Mental status is at baseline.     Cranial Nerves: No cranial nerve deficit.     Sensory: No sensory deficit.     Motor: No weakness.     Coordination: Coordination normal.     Gait: Gait normal.     Deep Tendon  Reflexes: Reflexes normal.  Psychiatric:        Mood and Affect: Mood normal.        Behavior: Behavior normal.        Thought Content: Thought content normal.        Judgment: Judgment normal.      Assessment & Plan:  Benign essential HTN - Plan: CBC with Differential/Platelet, COMPLETE METABOLIC PANEL WITH GFR, Lipid panel  Prostate cancer screening - Plan: PSA  Flu vaccine need - Plan: Flu Vaccine Trivalent High Dose (Fluad)  General medical exam I reviewed SDOH screening performed by my nurse.  Patient denies any falls, depression, or memory loss.  He received his flu shot today.  I made him aware of his tetanus shot but he declines a tetanus shot today.  Colon cancer screening is up-to-date.  I will screen the patient for prostate cancer with a PSA.  Check CBC CMP and a lipid panel.  Goal LDL cholesterol is less than 100.  We discussed tinnitus.  I recommended wearing hearing protection to keep this from worsening.  I explained that hearing aids help to "drown out the tinnitus".  I also explained that medications do not stop this from progressing.  Blood pressure is excellent

## 2023-04-27 LAB — CBC WITH DIFFERENTIAL/PLATELET
Absolute Lymphocytes: 1360 {cells}/uL (ref 850–3900)
Absolute Monocytes: 502 {cells}/uL (ref 200–950)
Basophils Absolute: 38 {cells}/uL (ref 0–200)
Basophils Relative: 0.5 %
Eosinophils Absolute: 213 {cells}/uL (ref 15–500)
Eosinophils Relative: 2.8 %
HCT: 46.7 % (ref 38.5–50.0)
Hemoglobin: 15.5 g/dL (ref 13.2–17.1)
MCH: 29.8 pg (ref 27.0–33.0)
MCHC: 33.2 g/dL (ref 32.0–36.0)
MCV: 89.6 fL (ref 80.0–100.0)
MPV: 9.9 fL (ref 7.5–12.5)
Monocytes Relative: 6.6 %
Neutro Abs: 5487 {cells}/uL (ref 1500–7800)
Neutrophils Relative %: 72.2 %
Platelets: 259 10*3/uL (ref 140–400)
RBC: 5.21 10*6/uL (ref 4.20–5.80)
RDW: 12 % (ref 11.0–15.0)
Total Lymphocyte: 17.9 %
WBC: 7.6 10*3/uL (ref 3.8–10.8)

## 2023-04-27 LAB — COMPLETE METABOLIC PANEL WITH GFR
AG Ratio: 2 (calc) (ref 1.0–2.5)
ALT: 14 U/L (ref 9–46)
AST: 16 U/L (ref 10–35)
Albumin: 4.2 g/dL (ref 3.6–5.1)
Alkaline phosphatase (APISO): 89 U/L (ref 35–144)
BUN: 11 mg/dL (ref 7–25)
CO2: 26 mmol/L (ref 20–32)
Calcium: 9.2 mg/dL (ref 8.6–10.3)
Chloride: 99 mmol/L (ref 98–110)
Creat: 0.97 mg/dL (ref 0.70–1.35)
Globulin: 2.1 g/dL (ref 1.9–3.7)
Glucose, Bld: 92 mg/dL (ref 65–99)
Potassium: 4.4 mmol/L (ref 3.5–5.3)
Sodium: 134 mmol/L — ABNORMAL LOW (ref 135–146)
Total Bilirubin: 0.7 mg/dL (ref 0.2–1.2)
Total Protein: 6.3 g/dL (ref 6.1–8.1)
eGFR: 86 mL/min/{1.73_m2} (ref >=60)

## 2023-04-27 LAB — LIPID PANEL
Cholesterol: 142 mg/dL (ref <200)
HDL: 41 mg/dL (ref >=40)
LDL Cholesterol (Calc): 77 mg/dL
Non-HDL Cholesterol (Calc): 101 mg/dL (ref <130)
Total CHOL/HDL Ratio: 3.5 (calc) (ref <5.0)
Triglycerides: 140 mg/dL (ref <150)

## 2023-04-27 LAB — PSA: PSA: 2.04 ng/mL (ref <=4.00)

## 2023-05-17 ENCOUNTER — Other Ambulatory Visit: Payer: Self-pay | Admitting: Family Medicine

## 2023-05-17 DIAGNOSIS — I10 Essential (primary) hypertension: Secondary | ICD-10-CM

## 2023-05-20 NOTE — Telephone Encounter (Signed)
Requested Prescriptions  Pending Prescriptions Disp Refills   doxazosin (CARDURA) 4 MG tablet [Pharmacy Med Name: Doxazosin Mesylate 4 MG Oral Tablet] 90 tablet 0    Sig: Take 1 tablet by mouth once daily     Cardiovascular:  Alpha Blockers Failed - 05/17/2023  2:50 PM      Failed - Valid encounter within last 6 months    Recent Outpatient Visits           1 year ago Benign essential HTN   [facility] Donita Brooks, MD   2 years ago Benign essential HTN   [facility] Donita Brooks, MD   3 years ago Benign essential HTN   [facility] Tanya Nones, Priscille Heidelberg, MD   4 years ago Benign essential HTN   [facility] Tanya Nones, Priscille Heidelberg, MD   4 years ago Benign essential HTN   [facility] Pickard, Priscille Heidelberg, MD              Passed - Last BP in normal range    BP Readings from Last 1 Encounters:  04/26/23 138/72          atenolol (TENORMIN) 50 MG tablet [Pharmacy Med Name: Atenolol 50 MG Oral Tablet] 90 tablet 0    Sig: TAKE 1 TABLET BY MOUTH ONCE DAILY . APPOINTMENT REQUIRED FOR FUTURE REFILLS     Cardiovascular: Beta Blockers 2 Failed - 05/17/2023  2:50 PM      Failed - Valid encounter within last 6 months    Recent Outpatient Visits           1 year ago Benign essential HTN   [facility] Tanya Nones, Priscille Heidelberg, MD   2 years ago Benign essential HTN   [facility] Donita Brooks, MD   3 years ago Benign essential HTN   [facility] Donita Brooks, MD   4 years ago Benign essential HTN   [facility] Donita Brooks, MD   4 years ago Benign essential HTN   [facility] Pickard, Priscille Heidelberg, MD              Passed - Cr in normal range and within 360 days    Creat  Date Value Ref Range Status  04/26/2023 0.97 0.70 - 1.35 mg/dL Final         Passed - Last BP in normal range    BP Readings from Last 1  Encounters:  04/26/23 138/72         Passed - Last Heart Rate in normal range    Pulse Readings from Last 1 Encounters:  04/26/23 70          losartan (COZAAR) 100 MG tablet [Pharmacy Med Name: Losartan Potassium 100 MG Oral Tablet] 90 tablet 0    Sig: Take 1 tablet by mouth once daily     Cardiovascular:  Angiotensin Receptor Blockers Failed - 05/17/2023  2:50 PM      Failed - Valid encounter within last 6 months    Recent Outpatient Visits           1 year ago Benign essential HTN   [facility] Donita Brooks, MD   2 years ago Benign essential HTN   [facility] Tanya Nones, Priscille Heidelberg, MD   3 years ago Benign essential HTN   [facility] [place],  Priscille Heidelberg, MD   4 years ago Benign essential HTN   [facility] Pickard, Priscille Heidelberg, MD   4 years ago Benign essential HTN   [facility] Pickard, Priscille Heidelberg, MD              Passed - Cr in normal range and within 180 days    Creat  Date Value Ref Range Status  04/26/2023 0.97 0.70 - 1.35 mg/dL Final         Passed - K in normal range and within 180 days    Potassium  Date Value Ref Range Status  04/26/2023 4.4 3.5 - 5.3 mmol/L Final         Passed - Patient is not pregnant      Passed - Last BP in normal range    BP Readings from Last 1 Encounters:  04/26/23 138/72

## 2023-05-21 DIAGNOSIS — K080 Exfoliation of teeth due to systemic causes: Secondary | ICD-10-CM | POA: Diagnosis not present

## 2023-05-28 ENCOUNTER — Encounter: Payer: Self-pay | Admitting: Family Medicine

## 2023-05-28 ENCOUNTER — Ambulatory Visit: Payer: Medicare Other | Admitting: Family Medicine

## 2023-05-28 VITALS — BP 142/60 | HR 62 | Temp 98.0°F | Ht 66.0 in | Wt 181.0 lb

## 2023-05-28 DIAGNOSIS — R600 Localized edema: Secondary | ICD-10-CM

## 2023-05-28 NOTE — Assessment & Plan Note (Signed)
No obvious swelling on exam with normal capillary refill, color, and sensation. Recent labs unremarkable. No recent ECHO. Swelling began after starting Trazodone so will hold that for now and if persists consider ECHO, repeat labs, further workup. Instructed to seek medical care for calf pain with walking, worsening swelling, redness, warmth, SOB. Return to office if symptoms persist or worsen.

## 2023-05-28 NOTE — Progress Notes (Signed)
Subjective:  HPI: Paul Hensley is a 67 y.o. male presenting on 05/28/2023 for Acute Visit (swollen feet/)   HPI Patient is in today for swelling of both feet since Monday after starting Trazodone over the weekend Friday, Saturday, and Sunday nights. He denies swelling, redness, or warmth above his ankle, no recent injury, fall, or trauma, no recent travel or history of DVT, SOB, cough. Recent labs unremarkable with no signs of liver or kidney dysfunction.   Review of Systems  All other systems reviewed and are negative.   Relevant past medical history reviewed and updated as indicated.   Past Medical History:  Diagnosis Date   GAD (generalized anxiety disorder)    Hiatal hernia    High triglycerides    Hypertension      No past surgical history on file.  Allergies and medications reviewed and updated.   Current Outpatient Medications:    atenolol (TENORMIN) 50 MG tablet, TAKE 1 TABLET BY MOUTH ONCE DAILY . APPOINTMENT REQUIRED FOR FUTURE REFILLS, Disp: 90 tablet, Rfl: 0   doxazosin (CARDURA) 4 MG tablet, Take 1 tablet by mouth once daily, Disp: 90 tablet, Rfl: 0   finasteride (PROSCAR) 5 MG tablet, Take 1 tablet by mouth once daily, Disp: 90 tablet, Rfl: 1   losartan (COZAAR) 100 MG tablet, Take 1 tablet by mouth once daily, Disp: 90 tablet, Rfl: 0   meloxicam (MOBIC) 15 MG tablet, Take 1 tablet by mouth once daily, Disp: 90 tablet, Rfl: 0   traZODone (DESYREL) 50 MG tablet, Take 1 tablet (50 mg total) by mouth at bedtime as needed for sleep., Disp: 30 tablet, Rfl: 1  Allergies  Allergen Reactions   Effexor [Venlafaxine Hydrochloride]     Objective:   BP (!) 142/60   Pulse 62   Temp 98 F (36.7 C) (Oral)   Ht 5\' 6" (1.676 m)   Wt 181 lb (82.1 kg)   SpO2 96%   BMI 29.21 kg/m      11 /19/2024   10:15 AM 05/28/2023   10:12 AM 04/26/2023   11:12 AM  Vitals with BMI  Height  5\' 6"  5\' 6"   Weight  181 lbs 172 lbs 10 oz  BMI  29.23 27.87  Systolic 142 146 161   Diastolic 60 60 72  Pulse  62 70     Physical Exam Vitals and nursing note reviewed.  Constitutional:      Appearance: Normal appearance. He is normal weight.  HENT:     Head: Normocephalic and atraumatic.  Cardiovascular:     Pulses:          Dorsalis pedis pulses are 1+ on the right side and 1+ on the left side.       Posterior tibial pulses are 1+ on the right side and 1+ on the left side.  Musculoskeletal:     Right lower leg: No edema.     Left lower leg: No edema.  Feet:     Right foot:     Skin integrity: Skin integrity normal.     Left foot:     Skin integrity: Skin integrity normal.  Skin:    General: Skin is warm and dry.     Capillary Refill: Capillary refill takes less than 2 seconds.  Neurological:     General: No focal deficit present.     Mental Status: He is alert and oriented to person, place, and time. Mental status is at baseline.  Psychiatric:  Mood and Affect: Mood normal.        Behavior: Behavior normal.        Thought Content: Thought content normal.        Judgment: Judgment normal.     Assessment & Plan:  Bilateral lower extremity edema Assessment & Plan: No obvious swelling on exam with normal capillary refill, color, and sensation. Recent labs unremarkable. No recent ECHO. Swelling began after starting Trazodone so will hold that for now and if persists consider ECHO, repeat labs, further workup. Instructed to seek medical care for calf pain with walking, worsening swelling, redness, warmth, SOB. Return to office if symptoms persist or worsen.       Follow up plan: Return if symptoms worsen or fail to improve.  Park Meo, FNP

## 2023-06-05 ENCOUNTER — Telehealth: Payer: Self-pay

## 2023-06-05 NOTE — Telephone Encounter (Signed)
Copied from CRM [id number]. Topic: General - Other >> Jun 05, 2023 12:00 PM Suzette B wrote: Reason for CRM: Patient called in regards to information about his insurance request for an annual.  Patient stated that he thought he had completed the annual exam in October;  however the insurance company stated that he had not completed the annual and patient is requesting a call back to explain to him what else needed to be done to complete it  ---------------------------------------------------- 06/05/23  Spoke w/pt. Suggest that he called his insurance to ask them exactly what other appointments does he need. Pt had his AWV on 04/26/23 w/pcp.   Pt voiced understanding and will f/u with insurance

## 2023-06-11 DIAGNOSIS — K080 Exfoliation of teeth due to systemic causes: Secondary | ICD-10-CM | POA: Diagnosis not present

## 2023-07-11 ENCOUNTER — Encounter: Payer: Self-pay | Admitting: Podiatry

## 2023-07-11 ENCOUNTER — Ambulatory Visit: Payer: Medicare Other | Admitting: Podiatry

## 2023-07-11 DIAGNOSIS — M778 Other enthesopathies, not elsewhere classified: Secondary | ICD-10-CM

## 2023-07-11 MED ORDER — TRIAMCINOLONE ACETONIDE 40 MG/ML IJ SUSP
40.0000 mg | Freq: Once | INTRAMUSCULAR | Status: AC
Start: 2023-07-11 — End: 2023-07-11
  Administered 2023-07-11: 40 mg

## 2023-07-11 NOTE — Progress Notes (Signed)
 He presents today states that he has had about with ringing of the ears tinnitus and is getting a little bit better.  States that he is retired from working over the past 3 months his feet are doing some better but is still feeling sore under here as he points to the second metatarsal phalangeal joint area.  Objective: Vital signs are stable he is alert and oriented x 3.  There is no erythema edema salines drainage or odor has tenderness on range of motion of the second metatarsal phalangeal joint bilaterally.  Assessment: Bursitis capsulitis of the second metatarsophalangeal joints resolving neuromas.  Plan: I injected the first intermetatarsal space around the second metatarsal phalangeal joint bilaterally.  He was injected with 5 mg of Kenalog  5 mg Marcaine  tolerated procedure well we will follow-up with him in 3 months if necessary.

## 2023-07-12 ENCOUNTER — Ambulatory Visit: Payer: Medicare Other | Admitting: Family Medicine

## 2023-07-15 DIAGNOSIS — K080 Exfoliation of teeth due to systemic causes: Secondary | ICD-10-CM | POA: Diagnosis not present

## 2023-08-17 ENCOUNTER — Other Ambulatory Visit: Payer: Self-pay | Admitting: Family Medicine

## 2023-08-17 DIAGNOSIS — I10 Essential (primary) hypertension: Secondary | ICD-10-CM

## 2023-08-26 ENCOUNTER — Encounter: Payer: Self-pay | Admitting: Family Medicine

## 2023-08-26 ENCOUNTER — Other Ambulatory Visit: Payer: Self-pay

## 2023-08-26 ENCOUNTER — Ambulatory Visit: Payer: Self-pay | Admitting: Family Medicine

## 2023-08-26 ENCOUNTER — Ambulatory Visit (INDEPENDENT_AMBULATORY_CARE_PROVIDER_SITE_OTHER): Payer: Medicare Other | Admitting: Family Medicine

## 2023-08-26 ENCOUNTER — Telehealth: Payer: Self-pay

## 2023-08-26 VITALS — BP 116/60 | HR 88 | Temp 99.0°F | Ht 66.0 in | Wt 177.0 lb

## 2023-08-26 DIAGNOSIS — R6889 Other general symptoms and signs: Secondary | ICD-10-CM | POA: Diagnosis not present

## 2023-08-26 DIAGNOSIS — R059 Cough, unspecified: Secondary | ICD-10-CM | POA: Diagnosis not present

## 2023-08-26 DIAGNOSIS — R300 Dysuria: Secondary | ICD-10-CM | POA: Diagnosis not present

## 2023-08-26 LAB — URINALYSIS, ROUTINE W REFLEX MICROSCOPIC
Bacteria, UA: NONE SEEN /[HPF]
Glucose, UA: NEGATIVE
Hyaline Cast: NONE SEEN /[LPF]
Ketones, ur: NEGATIVE
Leukocytes,Ua: NEGATIVE
Nitrite: NEGATIVE
Specific Gravity, Urine: 1.02 (ref 1.001–1.035)
Squamous Epithelial / HPF: NONE SEEN /[HPF] (ref <=5)
WBC, UA: NONE SEEN /[HPF] (ref 0–5)
pH: 7 (ref 5.0–8.0)

## 2023-08-26 LAB — INFLUENZA A AND B AG, IMMUNOASSAY
INFLUENZA A ANTIGEN: NOT DETECTED
INFLUENZA B ANTIGEN: NOT DETECTED

## 2023-08-26 LAB — MICROSCOPIC MESSAGE

## 2023-08-26 MED ORDER — BENZONATATE 100 MG PO CAPS: 100.0000 mg | ORAL_CAPSULE | Freq: Three times a day (TID) | ORAL | 0 refills | Status: DC | PRN

## 2023-08-26 NOTE — Telephone Encounter (Signed)
 Copied from CRM [id number]. Topic: Clinical - Prescription Issue >> Aug 26, 2023  2:20 PM Geroge Baseman wrote: Reason for CRM: benzonatate (TESSALON PERLES) 100 MG capsule, needs this to be sent over to [facility] [address] [facility]. Please advise patient when completed.

## 2023-08-26 NOTE — Telephone Encounter (Signed)
 Copied from CRM [id number]. Topic: Clinical - Red Word Triage >> Aug 26, 2023  8:02 AM Geroge Baseman wrote: Red Word that prompted transfer to Nurse Triage: Patient running a fever 100.7 but has been higher, terrible cough, when he urinates it burns a little, leg cramps.   Chief Complaint: Burning with urination Symptoms: Cough, headache,fever Frequency: Ongoing since yesterday Disposition: [] ED /[] Urgent Care (no appt availability in office) / [x] Appointment(In office/virtual)/ []  [facility]/ [] Home Care/ [] Refused Recommended Disposition /[] [facility]/ []  Follow-up with PCP Additional Notes: Patient stated he is having burning with urination. He also stated he is having cough, fever sinus pain, and runny nose. He has been taking Sudafed and Tylenol. Temp today is 100.7 Same day appointment scheduled.   Reason for Disposition  All other males with painful urination  Answer Assessment - Initial Assessment Questions 1. SEVERITY: "How bad is the pain?"  (e.g., Scale 1-10; mild, moderate, or severe)   - MILD (1-3): Complains slightly about urination hurting.   - MODERATE (4-7): Interferes with normal activities.     - SEVERE (8-10): Excruciating, unwilling or unable to urinate because of the pain.      Moderate  2. ONSET: "When did the painful urination start?"      Yesterday  3. FEVER: "Do you have a fever?" If Yes, ask: "What is your temperature, how was it measured, and when did it start?"     100.7 after taking Tylenol today  4. OTHER SYMPTOMS: "Do you have any other symptoms?" (e.g., flank pain, penis discharge, scrotal pain, blood in urine)     No  Answer Assessment - Initial Assessment Questions 1. ONSET: "When did the cough begin?"      Yesterday  2. SEVERITY: "How bad is the cough today?"      Mild currently but was coughing all last night  3. SPUTUM: "Describe the color of your sputum" (none, dry cough; clear, white, yellow, green)     N/A  4.  HEMOPTYSIS: "Are you coughing up any blood?" If so ask: "How much?" (flecks, streaks, tablespoons, etc.)     N/A  5. DIFFICULTY BREATHING: "Are you having difficulty breathing?" If Yes, ask: "How bad is it?" (e.g., mild, moderate, severe)    - MILD: No SOB at rest, mild SOB with walking, speaks normally in sentences, can lie down, no retractions, pulse < 100.    - MODERATE: SOB at rest, SOB with minimal exertion and prefers to sit, cannot lie down flat, speaks in phrases, mild retractions, audible wheezing, pulse 100-120.    - SEVERE: Very SOB at rest, speaks in single words, struggling to breathe, sitting hunched forward, retractions, pulse > 120      No  6. FEVER: "Do you have a fever?" If Yes, ask: "What is your temperature, how was it measured, and when did it start?"  Yes, 100. 7  7. OTHER SYMPTOMS: "Do you have any other symptoms?" (e.g., runny nose, wheezing, chest pain)       Little bit of sinus pain  Protocols used: Urination Pain - Male-A-AH, Cough - Acute Non-Productive-A-AH

## 2023-08-26 NOTE — Progress Notes (Signed)
 Patient Office Visit  Assessment & Plan:  Dysuria -     Urinalysis, Routine w reflex microscopic -     Urine Culture  Flu-like symptoms -     Influenza A and B Ag, Immunoassay -     Benzonatate; Take 1 capsule (100 mg total) by mouth 3 (three) times daily as needed.  Dispense: 30 capsule; Refill: 0   Flu test negative.  Will follow-up on urine culture result notify patient.  Tessalon Perles sent to the pharmacy.  If no improvement or worsening symptoms he is to notify us. Return if symptoms worsen or fail to improve.   Subjective:    Patient ID: Paul Hensley, male    DOB: 1955-08-29  Age: 68 y.o. MRN: [id number]  Chief Complaint  Patient presents with   Dysuria    X 2 days   Muscle Pain    Cramps in legs.   Cough    Non productive x 2 days.     HPI Dysuria- having burning the past 2 days, no blood. No hx of kidney stones. Had fever 102.  Yesterday. Pt having muscle aches.  No nausea vomiting or back pain.  No previous history of a prostate infection.  Patient believes he had a UTI many years ago.  No concerns regarding STIs i.e. chlamydia gonorrhea.  Feels bad and does not know why.  Patient retired a few months ago and thinks he has not been feeling right since then. Cough-having a dry cough for 2 days, no SOB, no wheezing. No ill contacts.  Feeling fatigued, decreased appetite. Took 2  pseudofed last night and noticed that tinnitus improved after taking the Sudafed.  Took Tylenol this morning.  Temperature was 102 yesterday.  No tobacco. Did not sleep good last night he struggles with insomnia. Leg cramps- noticed this yesterday but not today. Pt is trying to drink more water. Pt got flu shot this season  The 10-year ASCVD risk score (Arnett DK, et al., 2019) is: 14.1%  Past Medical History:  Diagnosis Date   GAD (generalized anxiety disorder)    Hiatal hernia    High triglycerides    Hypertension    History reviewed. No pertinent surgical history. Social History    Tobacco Use   Smoking status: Former    Current packs/day: 0.00    Types: Cigarettes    Quit date: 10/02/1971    Years since quitting: 51.9   Smokeless tobacco: Never  Vaping Use   Vaping status: Never Used  Substance Use Topics   Alcohol use: No   Drug use: No   History reviewed. No pertinent family history. Allergies  Allergen Reactions   Effexor [Venlafaxine Hydrochloride]     ROS    Objective:    BP 116/60   Pulse 88   Temp 99 F (37.2 C)   Ht 5\' 6"  (1.676 m)   Wt 177 lb (80.3 kg)   SpO2 99%   BMI 28.57 kg/m  BP Readings from Last 3 Encounters:  08/26/23 116/60  05/28/23 (!) 142/60  04/26/23 138/72   Wt Readings from Last 3 Encounters:  08/26/23 177 lb (80.3 kg)  05/28/23 181 lb (82.1 kg)  04/26/23 172 lb 9.6 oz (78.3 kg)    Physical Exam Vitals and nursing note reviewed.  Constitutional:      General: He is not in acute distress.    Appearance: Normal appearance.  HENT:     Head: Normocephalic.     Right Ear: Tympanic  membrane, ear canal and external ear normal.     Left Ear: Tympanic membrane, ear canal and external ear normal.     Ears:     Comments: Right TM slightly dull appearing, nonerythemtaous, nonbulging Eyes:     Extraocular Movements: Extraocular movements intact.     Conjunctiva/sclera: Conjunctivae normal.     Pupils: Pupils are equal, round, and reactive to light.  Cardiovascular:     Rate and Rhythm: Normal rate and regular rhythm.     Heart sounds: Normal heart sounds.  Pulmonary:     Effort: Pulmonary effort is normal.     Breath sounds: Normal breath sounds. No wheezing.  Abdominal:     Tenderness: There is no abdominal tenderness. There is no right CVA tenderness or left CVA tenderness.  Neurological:     General: No focal deficit present.     Mental Status: He is alert and oriented to person, place, and time.  Psychiatric:        Mood and Affect: Mood normal.        Behavior: Behavior normal.        Thought Content:  Thought content normal.        Judgment: Judgment normal.      No results found for any visits on 08/26/23.

## 2023-08-27 LAB — URINE CULTURE
MICRO NUMBER:: 16092436
Result:: NO GROWTH
SPECIMEN QUALITY:: ADEQUATE

## 2023-10-04 ENCOUNTER — Encounter: Payer: Self-pay | Admitting: Family Medicine

## 2023-10-04 ENCOUNTER — Ambulatory Visit: Admitting: Family Medicine

## 2023-10-04 VITALS — BP 130/72 | HR 58 | Temp 97.9°F | Ht 66.0 in | Wt 177.4 lb

## 2023-10-04 DIAGNOSIS — K219 Gastro-esophageal reflux disease without esophagitis: Secondary | ICD-10-CM

## 2023-10-04 DIAGNOSIS — K429 Umbilical hernia without obstruction or gangrene: Secondary | ICD-10-CM

## 2023-10-04 MED ORDER — OMEPRAZOLE 40 MG PO CPDR: 40.0000 mg | DELAYED_RELEASE_CAPSULE | Freq: Every day | ORAL | 3 refills | Status: AC

## 2023-10-04 NOTE — Progress Notes (Signed)
 Subjective:    Patient ID: Paul Hensley, male    DOB: Jan 20, 1956, 68 y.o.   MRN: [id number] Patient's 2 concerns today.  First he has a large umbilical hernia.  Roughly 3.5 cm in diameter.  It has been there for years.  It is freely reducible.  He denies any blood in stool or change in bowel habits however he would like to have it repaired now that he is retired.  Second he has been having more heartburn.  He reports a burning sensation around the xiphoid process that radiates up in his throat when he bends over.  He also reports burping and a bitter acid like fluid in his mouth at times.  He denies any chest pain or numbness of breath or angina. Past Medical History:  Diagnosis Date  . GAD (generalized anxiety disorder)   . Hiatal hernia   . High triglycerides   . Hypertension    No past surgical history on file. Current Outpatient Medications on File Prior to Visit  Medication Sig Dispense Refill  . atenolol (TENORMIN) 50 MG tablet TAKE 1 TABLET BY MOUTH ONCE DAILY . APPOINTMENT REQUIRED FOR FUTURE REFILLS 90 tablet 0  . doxazosin (CARDURA) 4 MG tablet Take 1 tablet by mouth once daily 90 tablet 0  . finasteride (PROSCAR) 5 MG tablet Take 1 tablet by mouth once daily 90 tablet 0  . losartan (COZAAR) 100 MG tablet Take 1 tablet by mouth once daily 90 tablet 0  . meloxicam (MOBIC) 15 MG tablet Take 1 tablet by mouth once daily 90 tablet 0  . benzonatate (TESSALON PERLES) 100 MG capsule Take 1 capsule (100 mg total) by mouth 3 (three) times daily as needed. (Patient not taking: Reported on 10/04/2023) 30 capsule 0  . traZODone (DESYREL) 50 MG tablet Take 1 tablet (50 mg total) by mouth at bedtime as needed for sleep. (Patient not taking: Reported on 10/04/2023) 30 tablet 1   No current facility-administered medications on file prior to visit.   Allergies  Allergen Reactions  . Effexor [Venlafaxine Hydrochloride]    Social History   Socioeconomic History  . Marital status: Married     Spouse name: Not on file  . Number of children: Not on file  . Years of education: Not on file  . Highest education level: Not on file  Occupational History  . Occupation: Retired 04/05/2023  Tobacco Use  . Smoking status: Former    Current packs/day: 0.00    Types: Cigarettes    Quit date: 10/02/1971    Years since quitting: 52.0  . Smokeless tobacco: Never  Vaping Use  . Vaping status: Never Used  Substance and Sexual Activity  . Alcohol use: No  . Drug use: No  . Sexual activity: Not Currently  Other Topics Concern  . Not on file  Social History Narrative  . Not on file   Social Drivers of Health   Financial Resource Strain: Low Risk  (04/26/2023)   Overall Financial Resource Strain (CARDIA)   . Difficulty of Paying Living Expenses: Not very hard  Food Insecurity: No Food Insecurity (04/26/2023)   Hunger Vital Sign   . Worried About Programme researcher, broadcasting/film/video in the Last Year: Never true   . Ran Out of Food in the Last Year: Never true  Transportation Needs: No Transportation Needs (04/26/2023)   PRAPARE - Transportation   . Lack of Transportation (Medical): No   . Lack of Transportation (Non-Medical): No  Physical Activity: Sufficiently  Active (04/26/2023)   Exercise Vital Sign   . Days of Exercise per Week: 3 days   . Minutes of Exercise per Session: 60 min  Stress: Not on file  Social Connections: Socially Integrated (04/26/2023)   Social Connection and Isolation Panel [NHANES]   . Frequency of Communication with Friends and Family: More than three times a week   . Frequency of Social Gatherings with Friends and Family: More than three times a week   . Attends Religious Services: More than 4 times per year   . Active Member of Clubs or Organizations: Yes   . Attends Banker Meetings: More than 4 times per year   . Marital Status: Married  Catering manager Violence: Not At Risk (04/26/2023)   Humiliation, Afraid, Rape, and Kick questionnaire   . Fear of  Current or Ex-Partner: No   . Emotionally Abused: No   . Physically Abused: No   . Sexually Abused: No     Review of Systems  All other systems reviewed and are negative.      Objective:   Physical Exam Vitals reviewed.  Constitutional:      General: He is not in acute distress.    Appearance: He is well-developed. He is not diaphoretic.  Cardiovascular:     Rate and Rhythm: Normal rate and regular rhythm.     Heart sounds: Normal heart sounds. No murmur heard.    No friction rub. No gallop.  Pulmonary:     Effort: Pulmonary effort is normal. No respiratory distress.     Breath sounds: Normal breath sounds. No stridor. No wheezing or rales.  Abdominal:     General: Bowel sounds are normal.     Palpations: Abdomen is soft.     Hernia: A hernia is present. Hernia is present in the umbilical area.  Musculoskeletal:     Right foot: No swelling, laceration, tenderness, bony tenderness or crepitus.     Left foot: No swelling, laceration, tenderness, bony tenderness or crepitus.     Assessment & Plan:  Umbilical hernia without obstruction and without gangrene - Plan: Ambulatory referral to General Surgery Consult general surgery to repair the umbilical hernia.  Add omeprazole 40 mg a day for heartburn.  Recommended stopping NSAIDs which can exacerbate heartburn.  Recommended reducing carbonated beverages.

## 2023-10-08 ENCOUNTER — Telehealth: Payer: Self-pay

## 2023-10-08 NOTE — Telephone Encounter (Signed)
 Copied from CRM [id number]. Topic: Clinical - Medical Advice >> Oct 08, 2023 12:37 PM DeAngela L wrote: Reason for CRM: [facility] call to inform office that the Patient is scheduled for office consult on October 11 2023 10am with Dr Fredricka Bonine  Call back number [phone]

## 2023-10-10 ENCOUNTER — Encounter: Payer: Self-pay | Admitting: Podiatry

## 2023-10-10 ENCOUNTER — Ambulatory Visit: Payer: Medicare Other | Admitting: Podiatry

## 2023-10-10 DIAGNOSIS — M7752 Other enthesopathy of left foot: Secondary | ICD-10-CM | POA: Diagnosis not present

## 2023-10-10 DIAGNOSIS — M7751 Other enthesopathy of right foot: Secondary | ICD-10-CM

## 2023-10-10 MED ORDER — TRIAMCINOLONE ACETONIDE 40 MG/ML IJ SUSP
40.0000 mg | Freq: Once | INTRAMUSCULAR | Status: AC
  Administered 2023-10-10: 40 mg

## 2023-10-10 NOTE — Progress Notes (Signed)
 He presents today for follow-up of his capsulitis second metatarsophalangeal joint bilaterally.  He states that the injections really did not help with the tightness on the top of the foot but have really helped with the neuroma type symptoms.  He still feels some tightness on the top of the foot but some tenderness on the bottom with palpation.  Objective: Vital signs stable and oriented x 3 tenderness on palpation of the second interdigital space bilateral and sort of the plantar lateral aspect of the second metatarsophalangeal joint bilaterally.  Assessment: Capsulitis bilateral.  Plan: Injected dexamethasone for the plantar aspect at this time.  I explained to him that this very well could be a neuropathic type symptomatology particularly the tight feeling that he has.  May need to consider gabapentin Neurontin next visit.  Follow-up with him in about 3 months if necessary.

## 2023-10-11 ENCOUNTER — Ambulatory Visit: Payer: Self-pay | Admitting: Surgery

## 2023-10-11 DIAGNOSIS — K429 Umbilical hernia without obstruction or gangrene: Secondary | ICD-10-CM | POA: Diagnosis not present

## 2023-10-11 NOTE — H&P (Signed)
 Paul Hensley [id number]   Referring Provider:  Ralene Muskrat*   Subjective   Chief Complaint: New Consultation     History of Present Illness:    Very pleasant and relatively healthy 68 year old man who presents for evaluation of an umbilical hernia.  This has been present for about 10 years and has slowly increased in size over time.  Recently he has had more discomfort from this and noticed that it is occasionally feels like tightness or band pulling across his abdomen.  No associated GI symptoms.  No previous abdominal surgeries.  He would like to have this hernia fixed.  He works in Nurse, children's.   Review of Systems: A complete review of systems was obtained from the patient.  I have reviewed this information and discussed as appropriate with the patient.  See HPI as well for other ROS.   Medical History: Past Medical History:  Diagnosis Date   GERD (gastroesophageal reflux disease)    Hypertension     There is no problem list on file for this patient.   History reviewed. No pertinent surgical history.   No Known Allergies  Current Outpatient Medications on File Prior to Visit  Medication Sig Dispense Refill   atenoloL (TENORMIN) 50 MG tablet Take 1 tablet by mouth once daily     doxazosin (CARDURA) 4 MG tablet Take 1 tablet by mouth once daily     finasteride (PROSCAR) 5 mg tablet Take 1 tablet by mouth once daily     losartan (COZAAR) 100 MG tablet Take 1 tablet by mouth once daily     meloxicam (MOBIC) 15 MG tablet Take 1 tablet by mouth once daily     omeprazole (PRILOSEC) 40 MG DR capsule Take 40 mg by mouth once daily     No current facility-administered medications on file prior to visit.    Family History  Problem Relation Age of Onset   High blood pressure (Hypertension) Sister    Diabetes Sister      Social History   Tobacco Use  Smoking Status Former   Types: Cigarettes   Start date: 1985  Smokeless Tobacco Never     Social  History   Socioeconomic History   Marital status: Married  Tobacco Use   Smoking status: Former    Types: Cigarettes    Start date: 1985   Smokeless tobacco: Never  Substance and Sexual Activity   Alcohol use: Never   Drug use: Never   Social Drivers of Corporate investment banker Strain: Low Risk  (04/26/2023)   Received from [facility]   Overall Financial Resource Strain (CARDIA)    Difficulty of Paying Living Expenses: Not very hard  Food Insecurity: No Food Insecurity (04/26/2023)   [facility]   Hunger Vital Sign    Worried About Running Out of Food in the Last Year: Never true    Ran Out of Food in the Last Year: Never true  Transportation Needs: No Transportation Needs (04/26/2023)   [facility] - [facility]    Lack of Transportation (Medical): No    Lack of Transportation (Non-Medical): No  Physical Activity: Sufficiently Active (04/26/2023)   Received from [facility]   Exercise Vital Sign    Days of Exercise per Week: 3 days    Minutes of Exercise per Session: 60 min  Social Connections: Socially Integrated (04/26/2023)   [facility] from [facility]   Social Connection and Isolation Panel [  NHANES]    Frequency of Communication with Friends and Family: More than three times a week    Frequency of Social Gatherings with Friends and Family: More than three times a week    Attends Religious Services: More than 4 times per year    Active Member of [employer] or Organizations: Yes    Attends Banker Meetings: More than 4 times per year    Marital Status: Married  Housing Stability: Unknown (10/11/2023)   Housing Stability Vital Sign    Homeless in the Last Year: No    Objective:    Vitals:   10/11/23 1014  BP: (!) 170/77  Pulse: 67  Temp: 36.9 C (98.4 F)  SpO2: 97%  Weight: 80.9 kg (178 lb 6.4 oz)  Height: 167.6 cm (5\' 6" )  PainSc: 0-No pain    Body mass index is 28.79 kg/m.  Gen: A&Ox3, no distress   Chest: respiratory effort is normal. Abdomen: soft, nondistended, nontender.  Chronically incarcerated umbilical hernia containing fat tissue which is not reducible, thinning and purple discoloration of the overlying skin Neuro: no gross deficit Psych: appropriate mood and affect, normal insight/judgment intact  Skin: warm and dry  Assessment and Plan:  Diagnoses and all orders for this visit:  Umbilical hernia without obstruction and without gangrene    We discussed the relevant anatomy and we discussed options for repair.  I recommend an open approach and went over the technique of the procedure.  Discussed risks of bleeding, infection, pain, scarring, injury to structures in the area, hernia recurrence, risk of seroma or hematoma, general cardiovascular, pulmonary, and thromboembolic complications.  We discussed typical postop recovery, timeline, and activity limitations.  We also discussed the option of ongoing observation, with high rate of ultimately returning for surgery and risk of increasing size/symptoms from the hernia as well as incarceration/strangulation and went over symptoms that should prompt the patient to seek emergency treatment.  Questions were welcomed and answered to the patient's satisfaction. Patient wishes to proceed with scheduling.   Phylliss Blakes MD FACS

## 2023-10-30 DIAGNOSIS — K080 Exfoliation of teeth due to systemic causes: Secondary | ICD-10-CM | POA: Diagnosis not present

## 2023-11-06 DIAGNOSIS — H04123 Dry eye syndrome of bilateral lacrimal glands: Secondary | ICD-10-CM | POA: Diagnosis not present

## 2023-11-06 DIAGNOSIS — H1045 Other chronic allergic conjunctivitis: Secondary | ICD-10-CM | POA: Diagnosis not present

## 2023-11-06 DIAGNOSIS — H18513 Endothelial corneal dystrophy, bilateral: Secondary | ICD-10-CM | POA: Diagnosis not present

## 2023-11-06 DIAGNOSIS — H40013 Open angle with borderline findings, low risk, bilateral: Secondary | ICD-10-CM | POA: Diagnosis not present

## 2023-11-12 NOTE — Patient Instructions (Signed)
 SURGICAL WAITING ROOM VISITATION  Patients having surgery or a procedure may have no more than 2 support people in the waiting area - these visitors may rotate.    Children under the age of 38 must have an adult with them who is not the patient.  Due to an increase in RSV and influenza rates and associated hospitalizations, children ages 60 and under may not visit patients in [facility] hospitals.  Visitors with respiratory illnesses are discouraged from visiting and should remain at home.  If the patient needs to stay at the hospital during part of their recovery, the visitor guidelines for inpatient rooms apply. Pre-op nurse will coordinate an appropriate time for 1 support person to accompany patient in pre-op.  This support person may not rotate.    Please refer to the [facility] website for the visitor guidelines for Inpatients (after your surgery is over and you are in a regular room).    Your procedure is scheduled on: 11/18/23   Report to [facility] Main Entrance    Report to admitting at 11:45 AM   Call this number if you have problems the morning of surgery [phone]   Do not eat food or drink liquids :After Midnight.          If you have questions, please contact your surgeon's office.   FOLLOW BOWEL PREP AND ANY ADDITIONAL PRE OP INSTRUCTIONS YOU RECEIVED FROM YOUR SURGEON'S OFFICE!!!     Oral Hygiene is also important to reduce your risk of infection.                                    Remember - BRUSH YOUR TEETH THE MORNING OF SURGERY WITH YOUR REGULAR TOOTHPASTE  DENTURES WILL BE REMOVED PRIOR TO SURGERY PLEASE DO NOT APPLY "Poly grip" OR ADHESIVES!!!   Stop all vitamins and herbal supplements 7 days before surgery.   Take these medicines the morning of surgery with A SIP OF WATER: Atenolol , Doxazosin , Finasteride , Omeprazole                                You may not have any metal on your body including jewelry, and body piercing             Do  not wear lotions, powders, cologne, or deodorant              Men may shave face and neck.   Do not bring valuables to the hospital. [facility] IS NOT             RESPONSIBLE   FOR VALUABLES.   Contacts, glasses, dentures or bridgework may not be worn into surgery.  DO NOT BRING YOUR HOME MEDICATIONS TO THE HOSPITAL. PHARMACY WILL DISPENSE MEDICATIONS LISTED ON YOUR MEDICATION LIST TO YOU DURING YOUR ADMISSION IN THE HOSPITAL!    Patients discharged on the day of surgery will not be allowed to drive home.  Someone NEEDS to stay with you for the first 24 hours after anesthesia.              Please read over the following fact sheets you were given: IF YOU HAVE QUESTIONS ABOUT YOUR PRE-OP INSTRUCTIONS PLEASE CALL [name]    If you received a COVID test during your pre-op visit  it is requested that you wear a mask when out  in public, stay away from anyone that may not be feeling well and notify your surgeon if you develop symptoms. If you test positive for Covid or have been in contact with anyone that has tested positive in the last 10 days please notify you surgeon.    [facility] - Preparing for Surgery Before surgery, you can play an important role.  Because skin is not sterile, your skin needs to be as free of germs as possible.  You can reduce the number of germs on your skin by washing with CHG (chlorahexidine gluconate) soap before surgery.  CHG is an antiseptic cleaner which kills germs and bonds with the skin to continue killing germs even after washing. Please DO NOT use if you have an allergy to CHG or antibacterial soaps.  If your skin becomes reddened/irritated stop using the CHG and inform your nurse when you arrive at Short Stay. Do not shave (including legs and underarms) for at least 48 hours prior to the first CHG shower.  You may shave your face/neck.  Please follow these instructions carefully:  1.  Shower with CHG Soap the night before surgery and the   morning of surgery.  2.  If you choose to wash your hair, wash your hair first as usual with your normal  shampoo.  3.  After you shampoo, rinse your hair and body thoroughly to remove the shampoo.                             4.  Use CHG as you would any other liquid soap.  You can apply chg directly to the skin and wash.  Gently with a scrungie or clean washcloth.  5.  Apply the CHG Soap to your body ONLY FROM THE NECK DOWN.   Do   not use on face/ open                           Wound or open sores. Avoid contact with eyes, ears mouth and   genitals (private parts).                       Wash face,  Genitals (private parts) with your normal soap.             6.  Wash thoroughly, paying special attention to the area where your    surgery  will be performed.  7.  Thoroughly rinse your body with warm water from the neck down.  8.  DO NOT shower/wash with your normal soap after using and rinsing off the CHG Soap.                9.  Pat yourself dry with a clean towel.            10.  Wear clean pajamas.            11.  Place clean sheets on your bed the night of your first shower and do not  sleep with pets. Day of Surgery : Do not apply any lotions/deodorants the morning of surgery.  Please wear clean clothes to the hospital/surgery center.  FAILURE TO FOLLOW THESE INSTRUCTIONS MAY RESULT IN THE CANCELLATION OF YOUR SURGERY  PATIENT SIGNATURE_________________________________  NURSE SIGNATURE__________________________________  ________________________________________________________________________

## 2023-11-12 NOTE — Progress Notes (Signed)
 COVID Vaccine Completed: yes  Date of COVID positive in last 90 days:  PCP - Eliane Grooms, MD Cardiologist -   Chest x-ray -  EKG -  Stress Test -  ECHO -  Cardiac Cath -  Pacemaker/ICD device last checked: Spinal Cord Stimulator:  Bowel Prep -   Sleep Study -  CPAP -   Fasting Blood Sugar -  Checks Blood Sugar _____ times a day  Last dose of GLP1 agonist-  N/A GLP1 instructions:  Hold 7 days before surgery    Last dose of SGLT-2 inhibitors-  N/A SGLT-2 instructions:  Hold 3 days before surgery    Blood Thinner Instructions:  Last dose:   Time: Aspirin Instructions: Last Dose:  Activity level:  Can go up a flight of stairs and perform activities of daily living without stopping and without symptoms of chest pain or shortness of breath.  Able to exercise without symptoms  Unable to go up a flight of stairs without symptoms of     Anesthesia review:   Patient denies shortness of breath, fever, cough and chest pain at PAT appointment  Patient verbalized understanding of instructions that were given to them at the PAT appointment. Patient was also instructed that they will need to review over the PAT instructions again at home before surgery.

## 2023-11-13 ENCOUNTER — Encounter (HOSPITAL_COMMUNITY)
Admission: RE | Admit: 2023-11-13 | Discharge: 2023-11-13 | Disposition: A | Discharge status: Home / Self Care | Source: Ambulatory Visit | Attending: Surgery | Admitting: Surgery

## 2023-11-13 ENCOUNTER — Other Ambulatory Visit: Payer: Self-pay

## 2023-11-13 ENCOUNTER — Encounter (HOSPITAL_COMMUNITY): Payer: Self-pay

## 2023-11-13 VITALS — BP 156/76 | HR 61 | Temp 98.3°F | Resp 14 | Ht 66.0 in | Wt 174.0 lb

## 2023-11-13 DIAGNOSIS — I10 Essential (primary) hypertension: Secondary | ICD-10-CM | POA: Insufficient documentation

## 2023-11-13 DIAGNOSIS — Z01818 Encounter for other preprocedural examination: Secondary | ICD-10-CM | POA: Insufficient documentation

## 2023-11-13 DIAGNOSIS — Z01812 Encounter for preprocedural laboratory examination: Secondary | ICD-10-CM | POA: Diagnosis not present

## 2023-11-13 DIAGNOSIS — Z01810 Encounter for preprocedural cardiovascular examination: Secondary | ICD-10-CM | POA: Diagnosis not present

## 2023-11-13 LAB — CBC
HCT: 46.2 % (ref 39.0–52.0)
Hemoglobin: 15.5 g/dL (ref 13.0–17.0)
MCH: 29.7 pg (ref 26.0–34.0)
MCHC: 33.5 g/dL (ref 30.0–36.0)
MCV: 88.5 fL (ref 80.0–100.0)
Platelets: 284 10*3/uL (ref 150–400)
RBC: 5.22 MIL/uL (ref 4.22–5.81)
RDW: 12.5 % (ref 11.5–15.5)
WBC: 6.7 10*3/uL (ref 4.0–10.5)
nRBC: 0 % (ref 0.0–0.2)

## 2023-11-13 LAB — BASIC METABOLIC PANEL WITH GFR
Anion gap: 5 (ref 5–15)
BUN: 11 mg/dL (ref 8–23)
CO2: 24 mmol/L (ref 22–32)
Calcium: 8.8 mg/dL — ABNORMAL LOW (ref 8.9–10.3)
Chloride: 106 mmol/L (ref 98–111)
Creatinine, Ser: 0.81 mg/dL (ref 0.61–1.24)
GFR, Estimated: >60 mL/min (ref >60)
Glucose, Bld: 103 mg/dL — ABNORMAL HIGH (ref 70–99)
Potassium: 4.2 mmol/L (ref 3.5–5.1)
Sodium: 135 mmol/L (ref 135–145)

## 2023-11-16 ENCOUNTER — Other Ambulatory Visit: Payer: Self-pay | Admitting: Family Medicine

## 2023-11-16 DIAGNOSIS — I10 Essential (primary) hypertension: Secondary | ICD-10-CM

## 2023-11-18 ENCOUNTER — Ambulatory Visit (HOSPITAL_COMMUNITY): Admitting: Anesthesiology

## 2023-11-18 ENCOUNTER — Ambulatory Visit (HOSPITAL_BASED_OUTPATIENT_CLINIC_OR_DEPARTMENT_OTHER): Admitting: Anesthesiology

## 2023-11-18 ENCOUNTER — Other Ambulatory Visit: Payer: Self-pay

## 2023-11-18 ENCOUNTER — Encounter (HOSPITAL_COMMUNITY)
Admission: RE | Disposition: A | Discharge status: Home / Self Care | Payer: Self-pay | Source: Home / Self Care | Attending: Surgery

## 2023-11-18 ENCOUNTER — Ambulatory Visit (HOSPITAL_COMMUNITY)
Admission: RE | Admit: 2023-11-18 | Discharge: 2023-11-18 | Disposition: A | Discharge status: Home / Self Care | Attending: Surgery | Admitting: Surgery

## 2023-11-18 ENCOUNTER — Encounter (HOSPITAL_COMMUNITY): Payer: Self-pay | Admitting: Surgery

## 2023-11-18 DIAGNOSIS — K420 Umbilical hernia with obstruction, without gangrene: Secondary | ICD-10-CM | POA: Insufficient documentation

## 2023-11-18 DIAGNOSIS — K429 Umbilical hernia without obstruction or gangrene: Secondary | ICD-10-CM | POA: Diagnosis not present

## 2023-11-18 SURGERY — REPAIR, HERNIA, UMBILICAL, ADULT
Anesthesia: General

## 2023-11-18 MED ORDER — PROPOFOL 500 MG/50ML IV EMUL
INTRAVENOUS | Status: DC | PRN
Start: 2023-11-18 — End: 2023-11-18
  Administered 2023-11-18: 100 ug/kg/min via INTRAVENOUS

## 2023-11-18 MED ORDER — BUPIVACAINE-EPINEPHRINE (PF) 0.25% -1:200000 IJ SOLN
INTRAMUSCULAR | Status: AC
  Filled 2023-11-18: qty 30

## 2023-11-18 MED ORDER — PROPOFOL 10 MG/ML IV BOLUS
INTRAVENOUS | Status: AC
  Filled 2023-11-18: qty 20

## 2023-11-18 MED ORDER — BUPIVACAINE LIPOSOME 1.3 % IJ SUSP
INTRAMUSCULAR | Status: AC
  Filled 2023-11-18: qty 20

## 2023-11-18 MED ORDER — AMISULPRIDE (ANTIEMETIC) 5 MG/2ML IV SOLN: 10.0000 mg | Freq: Once | INTRAVENOUS | Status: DC | PRN

## 2023-11-18 MED ORDER — BUPIVACAINE-EPINEPHRINE 0.25% -1:200000 IJ SOLN
INTRAMUSCULAR | Status: DC | PRN
  Administered 2023-11-18: 30 mL

## 2023-11-18 MED ORDER — ONDANSETRON HCL 4 MG/2ML IJ SOLN
INTRAMUSCULAR | Status: DC | PRN
  Administered 2023-11-18: 4 mg via INTRAVENOUS

## 2023-11-18 MED ORDER — STERILE WATER FOR IRRIGATION IR SOLN
Status: DC | PRN
  Administered 2023-11-18: 1000 mL

## 2023-11-18 MED ORDER — GABAPENTIN 300 MG PO CAPS
300.0000 mg | ORAL_CAPSULE | ORAL | Status: AC
  Administered 2023-11-18: 300 mg via ORAL
  Filled 2023-11-18: qty 1

## 2023-11-18 MED ORDER — KETOROLAC TROMETHAMINE 30 MG/ML IJ SOLN
15.0000 mg | Freq: Once | INTRAMUSCULAR | Status: AC | PRN
  Administered 2023-11-18: 15 mg via INTRAVENOUS

## 2023-11-18 MED ORDER — ROCURONIUM BROMIDE 100 MG/10ML IV SOLN
INTRAVENOUS | Status: DC | PRN
  Administered 2023-11-18: 50 mg via INTRAVENOUS

## 2023-11-18 MED ORDER — OXYCODONE HCL 5 MG/5ML PO SOLN: 5.0000 mg | Freq: Once | ORAL | Status: AC | PRN

## 2023-11-18 MED ORDER — ROCURONIUM BROMIDE 10 MG/ML (PF) SYRINGE
PREFILLED_SYRINGE | INTRAVENOUS | Status: AC
  Filled 2023-11-18: qty 10

## 2023-11-18 MED ORDER — 0.9 % SODIUM CHLORIDE (POUR BTL) OPTIME
TOPICAL | Status: DC | PRN
  Administered 2023-11-18: 1000 mL

## 2023-11-18 MED ORDER — HYDROMORPHONE HCL 1 MG/ML IJ SOLN
0.2500 mg | INTRAMUSCULAR | Status: DC | PRN
  Administered 2023-11-18: 0.5 mg via INTRAVENOUS

## 2023-11-18 MED ORDER — OXYCODONE HCL 5 MG PO TABS
ORAL_TABLET | ORAL | Status: DC
Start: 2023-11-18 — End: 2023-11-18
  Filled 2023-11-18: qty 1

## 2023-11-18 MED ORDER — LACTATED RINGERS IV SOLN: INTRAVENOUS | Status: DC

## 2023-11-18 MED ORDER — PROPOFOL 10 MG/ML IV BOLUS
INTRAVENOUS | Status: DC | PRN
  Administered 2023-11-18: 100 mg via INTRAVENOUS
  Administered 2023-11-18: 20 mg via INTRAVENOUS
  Administered 2023-11-18: 150 mg via INTRAVENOUS

## 2023-11-18 MED ORDER — LIDOCAINE HCL (CARDIAC) PF 100 MG/5ML IV SOSY
PREFILLED_SYRINGE | INTRAVENOUS | Status: DC | PRN
  Administered 2023-11-18: 100 mg via INTRAVENOUS

## 2023-11-18 MED ORDER — ONDANSETRON HCL 4 MG/2ML IJ SOLN
INTRAMUSCULAR | Status: AC
Start: 2023-11-18 — End: ?
  Filled 2023-11-18: qty 2

## 2023-11-18 MED ORDER — LIDOCAINE HCL (PF) 2 % IJ SOLN
INTRAMUSCULAR | Status: AC
  Filled 2023-11-18: qty 5

## 2023-11-18 MED ORDER — ACETAMINOPHEN 500 MG PO TABS
1000.0000 mg | ORAL_TABLET | ORAL | Status: AC
  Administered 2023-11-18: 1000 mg via ORAL
  Filled 2023-11-18: qty 2

## 2023-11-18 MED ORDER — CHLORHEXIDINE GLUCONATE 0.12 % MT SOLN
15.0000 mL | Freq: Once | OROMUCOSAL | Status: AC
  Administered 2023-11-18: 15 mL via OROMUCOSAL

## 2023-11-18 MED ORDER — OXYCODONE HCL 5 MG PO TABS
5.0000 mg | ORAL_TABLET | Freq: Once | ORAL | Status: AC | PRN
  Administered 2023-11-18: 5 mg via ORAL

## 2023-11-18 MED ORDER — PROPOFOL 1000 MG/100ML IV EMUL
INTRAVENOUS | Status: AC
  Filled 2023-11-18: qty 100

## 2023-11-18 MED ORDER — OXYCODONE HCL 5 MG PO TABS: 5.0000 mg | ORAL_TABLET | Freq: Three times a day (TID) | ORAL | 0 refills | Status: AC | PRN

## 2023-11-18 MED ORDER — FENTANYL CITRATE (PF) 250 MCG/5ML IJ SOLN
INTRAMUSCULAR | Status: DC | PRN
  Administered 2023-11-18 (×2): 50 ug via INTRAVENOUS

## 2023-11-18 MED ORDER — HYDROMORPHONE HCL 1 MG/ML IJ SOLN
INTRAMUSCULAR | Status: AC
  Filled 2023-11-18: qty 1

## 2023-11-18 MED ORDER — KETOROLAC TROMETHAMINE 30 MG/ML IJ SOLN
INTRAMUSCULAR | Status: AC
  Filled 2023-11-18: qty 1

## 2023-11-18 MED ORDER — ACETAMINOPHEN 500 MG PO TABS: 1000.0000 mg | ORAL_TABLET | Freq: Once | ORAL | Status: DC

## 2023-11-18 MED ORDER — FENTANYL CITRATE (PF) 250 MCG/5ML IJ SOLN
INTRAMUSCULAR | Status: AC
  Filled 2023-11-18: qty 5

## 2023-11-18 MED ORDER — CEFAZOLIN SODIUM-DEXTROSE 2-4 GM/100ML-% IV SOLN
2.0000 g | INTRAVENOUS | Status: AC
  Administered 2023-11-18: 2 g via INTRAVENOUS
  Filled 2023-11-18: qty 100

## 2023-11-18 MED ORDER — MEPERIDINE HCL 50 MG/ML IJ SOLN: 6.2500 mg | INTRAMUSCULAR | Status: DC | PRN

## 2023-11-18 MED ORDER — CHLORHEXIDINE GLUCONATE 4 % EX SOLN: 60.0000 mL | Freq: Once | CUTANEOUS | Status: DC

## 2023-11-18 MED ORDER — MIDAZOLAM HCL 2 MG/2ML IJ SOLN
INTRAMUSCULAR | Status: AC
  Filled 2023-11-18: qty 2

## 2023-11-18 MED ORDER — DEXAMETHASONE SODIUM PHOSPHATE 10 MG/ML IJ SOLN
INTRAMUSCULAR | Status: DC | PRN
  Administered 2023-11-18: 8 mg via INTRAVENOUS

## 2023-11-18 MED ORDER — ONDANSETRON HCL 4 MG/2ML IJ SOLN: 4.0000 mg | Freq: Once | INTRAMUSCULAR | Status: DC | PRN

## 2023-11-18 MED ORDER — DEXAMETHASONE SODIUM PHOSPHATE 10 MG/ML IJ SOLN
INTRAMUSCULAR | Status: AC
  Filled 2023-11-18: qty 1

## 2023-11-18 MED ORDER — BUPIVACAINE LIPOSOME 1.3 % IJ SUSP: 20.0000 mL | Freq: Once | INTRAMUSCULAR | Status: DC

## 2023-11-18 MED ORDER — ORAL CARE MOUTH RINSE: 15.0000 mL | Freq: Once | OROMUCOSAL | Status: AC

## 2023-11-18 MED ORDER — DOCUSATE SODIUM 100 MG PO CAPS: 100.0000 mg | ORAL_CAPSULE | Freq: Two times a day (BID) | ORAL | 0 refills | Status: AC

## 2023-11-18 MED ORDER — SUGAMMADEX SODIUM 200 MG/2ML IV SOLN
INTRAVENOUS | Status: DC | PRN
  Administered 2023-11-18: 200 mg via INTRAVENOUS

## 2023-11-18 MED ORDER — MIDAZOLAM HCL 5 MG/5ML IJ SOLN
INTRAMUSCULAR | Status: DC | PRN
  Administered 2023-11-18: 2 mg via INTRAVENOUS

## 2023-11-18 SURGICAL SUPPLY — 23 items
BAG COUNTER SPONGE SURGICOUNT (BAG) IMPLANT
BENZOIN TINCTURE PRP APPL 2/3 (GAUZE/BANDAGES/DRESSINGS) IMPLANT
CHLORAPREP W/TINT 26 (MISCELLANEOUS) ×1 IMPLANT
COVER SURGICAL LIGHT HANDLE (MISCELLANEOUS) ×1 IMPLANT
DRAPE LAPAROSCOPIC ABDOMINAL (DRAPES) ×1 IMPLANT
ELECT REM PT RETURN 15FT ADLT (MISCELLANEOUS) ×1 IMPLANT
GAUZE SPONGE 4X4 12PLY STRL (GAUZE/BANDAGES/DRESSINGS) IMPLANT
GLOVE BIO SURGEON STRL SZ 6 (GLOVE) ×1 IMPLANT
GLOVE INDICATOR 6.5 STRL GRN (GLOVE) ×1 IMPLANT
GOWN STRL REUS W/ TWL LRG LVL3 (GOWN DISPOSABLE) ×1 IMPLANT
KIT BASIN OR (CUSTOM PROCEDURE TRAY) ×1 IMPLANT
KIT TURNOVER KIT A (KITS) IMPLANT
NDL HYPO 22X1.5 SAFETY MO (MISCELLANEOUS) IMPLANT
NEEDLE HYPO 22X1.5 SAFETY MO (MISCELLANEOUS) IMPLANT
PACK GENERAL/GYN (CUSTOM PROCEDURE TRAY) ×1 IMPLANT
SPIKE FLUID TRANSFER (MISCELLANEOUS) ×1 IMPLANT
STRIP CLOSURE SKIN 1/2X4 (GAUZE/BANDAGES/DRESSINGS) IMPLANT
SUT ETHIBOND 0 MO6 C/R (SUTURE) IMPLANT
SUT MNCRL AB 4-0 PS2 18 (SUTURE) ×1 IMPLANT
SUT PROLENE 2 0 CT2 30 (SUTURE) IMPLANT
SUT VIC AB 3-0 SH 27XBRD (SUTURE) IMPLANT
SYR CONTROL 10ML LL (SYRINGE) IMPLANT
TOWEL OR 17X26 10 PK STRL BLUE (TOWEL DISPOSABLE) ×1 IMPLANT

## 2023-11-18 NOTE — Anesthesia Postprocedure Evaluation (Signed)
 Anesthesia Post Note  Patient: Beckhem G Drohan  Procedure(s) Performed: REPAIR, HERNIA, UMBILICAL, ADULT     Patient location during evaluation: PACU Anesthesia Type: General Level of consciousness: awake and alert, oriented and patient cooperative Pain management: pain level controlled Vital Signs Assessment: post-procedure vital signs reviewed and stable Respiratory status: spontaneous breathing, nonlabored ventilation and respiratory function stable Cardiovascular status: blood pressure returned to baseline and stable Postop Assessment: no apparent nausea or vomiting Anesthetic complications: no   No notable events documented.  Last Vitals:  Vitals:   11/18/23 1419 11/18/23 1430  BP: (!) 104/46 (!) 93/49  Pulse: 70 (!) 55  Resp: 15 17  Temp: 36.4 C   SpO2: 95% 100%    Last Pain:  Vitals:   11/18/23 1430  TempSrc:   PainSc: Asleep                 Jacquelyne Matte

## 2023-11-18 NOTE — Anesthesia Preprocedure Evaluation (Addendum)
 Anesthesia Evaluation  Patient identified by MRN, date of birth, ID band Patient awake    Reviewed: Allergy & Precautions, NPO status , Patient's Chart, lab work & pertinent test results, reviewed documented beta blocker date and time   Airway Mallampati: II  TM Distance: >3 FB Neck ROM: Full    Dental no notable dental hx. (+) Missing, Dental Advisory Given,    Pulmonary former smoker   Pulmonary exam normal breath sounds clear to auscultation       Cardiovascular hypertension (154/79 preop, per pt normally 130s SBP), Pt. on medications and Pt. on home beta blockers Normal cardiovascular exam Rhythm:Regular Rate:Normal     Neuro/Psych  PSYCHIATRIC DISORDERS Anxiety     negative neurological ROS     GI/Hepatic Neg liver ROS, hiatal hernia,GERD  Medicated and Controlled,,Incarcerated umbilical hernia    Endo/Other  negative endocrine ROS    Renal/GU negative Renal ROS  negative genitourinary   Musculoskeletal negative musculoskeletal ROS (+)    Abdominal   Peds  Hematology negative hematology ROS (+) Hb 15.5   Anesthesia Other Findings   Reproductive/Obstetrics negative OB ROS                             Anesthesia Physical Anesthesia Plan  ASA: 2  Anesthesia Plan: General   Post-op Pain Management: Tylenol PO (pre-op)* and Ketamine IV*   Induction: Intravenous  PONV Risk Score and Plan: 4 or greater and Ondansetron , Dexamethasone, Midazolam and Treatment may vary due to age or medical condition  Airway Management Planned: Oral ETT  Additional Equipment: None  Intra-op Plan:   Post-operative Plan: Extubation in OR  Informed Consent: I have reviewed the patients History and Physical, chart, labs and discussed the procedure including the risks, benefits and alternatives for the proposed anesthesia with the patient or authorized representative who has indicated his/her  understanding and acceptance.     Dental advisory given  Plan Discussed with: CRNA  Anesthesia Plan Comments:        Anesthesia Quick Evaluation

## 2023-11-18 NOTE — Discharge Instructions (Signed)
 HERNIA REPAIR: POST OP INSTRUCTIONS   EAT Gradually transition to a high fiber diet with a fiber supplement over the next few weeks after discharge.  Start with a pureed / full liquid diet (see below)  WALK Walk an hour a day (cumulative- not all at once).  Control your pain to do that.    CONTROL PAIN Control pain so that you can walk, sleep, tolerate sneezing/coughing, and go up/down stairs.  HAVE A BOWEL MOVEMENT DAILY Keep your bowels regular to avoid problems.  OK to try a laxative to override constipation.  OK to use an antidiarrheal to slow down diarrhea.  Call if not better after 2 tries  CALL IF YOU HAVE PROBLEMS/CONCERNS Call if you are still struggling despite following these instructions. Call if you have concerns not answered by these instructions  ######################################################################    DIET: Follow a light bland diet & liquids the first 24 hours after arrival home, such as soup, liquids, starches, etc.  Be sure to drink plenty of fluids.  Quickly advance to a usual solid diet within a few days.  Avoid fast food or heavy meals initially as you are more likely to get nauseated or have irregular bowels.  A low-sugar, high-fiber diet for the rest of your life is ideal.   Take your usually prescribed home medications unless otherwise directed.  PAIN CONTROL: Pain is best controlled by a usual combination of three different methods TOGETHER: Ice/Heat Over the counter pain medication Prescription pain medication Most patients will experience some swelling and bruising around the hernia(s) such as the bellybutton, groins, or old incisions.  Ice packs or heating pads (30-60 minutes up to 6 times a day) will help. Use ice for the first few days to help decrease swelling and bruising, then switch to heat to help relax tight/sore spots and speed recovery.  Some people prefer to use ice alone, heat alone, alternating between ice & heat.  Experiment  to what works for you.  Swelling and bruising can take several weeks to resolve.   It is helpful to take an over-the-counter pain medication regularly for the first days: Naproxen (Aleve, etc)  Two 220mg  tabs twice a day OR Ibuprofen (Advil, etc) Three 200mg  tabs four times a day (every meal & bedtime) AND Acetaminophen (Tylenol, etc) 325-650mg  four times a day (every meal & bedtime) A  prescription for pain medication should be given to you upon discharge.  Take your pain medication as prescribed, IF NEEDED.  If you are having problems/concerns with the prescription medicine (does not control pain, nausea, vomiting, rash, itching, etc), please call us  [phone] to see if we need to switch you to a different pain medicine that will work better for you and/or control your side effect better. If you need a refill on your pain medication, please contact your pharmacy.  They will contact our office to request authorization. Prescriptions will not be filled after 5 pm or on week-ends.  Avoid getting constipated.  Between the surgery and the pain medications, it is common to experience some constipation.  Increasing fluid intake and taking a fiber supplement (such as Metamucil, Citrucel, FiberCon, MiraLax, etc) 1-2 times a day regularly will usually help prevent this problem from occurring.  A mild laxative (prune juice, Milk of Magnesia, MiraLax, etc) should be taken according to package directions if there are no bowel movements after 48 hours.    Wash / shower every day, starting 2 days after surgery.  You may shower over  the steri strips which are waterproof.  No rubbing, scrubbing, lotions or ointments to incision(s). Do not soak or submerge incision.   Remove your outer bandage 2 days after surgery. Steri strips (small white tapes) will peel off after 1-2 weeks. You may leave the incision open to air.  You may replace a dressing/Band-Aid to cover an incision for comfort if you wish.  Continue to  shower over incision(s) after the dressing is off.  ACTIVITIES as tolerated:   You may resume regular (light) daily activities beginning the next day--such as daily self-care, walking, climbing stairs--gradually increasing activities as tolerated.  Control your pain so that you can walk an hour a day.  If you can walk 30 minutes without difficulty, it is safe to try more intense activity such as jogging, treadmill, bicycling, low-impact aerobics, swimming, etc. Refrain from the most intensive and strenuous activity such as sit-ups, heavy lifting, contact sports, etc  Refrain from any heavy lifting or straining until 6 weeks after surgery.   DO NOT PUSH THROUGH PAIN.  Let pain be your guide: If it hurts to do something, don't do it.  Pain is your body warning you to avoid that activity for another week until the pain goes down. You may drive when you are no longer taking prescription pain medication, you can comfortably wear a seatbelt, and you can safely maneuver your car and apply brakes. You may have sexual intercourse when it is comfortable.   FOLLOW UP in our office Please call [facility] at [phone] to set up an appointment to see your surgeon in the office for a follow-up appointment approximately 2-3 weeks after your surgery. Make sure that you call for this appointment the day you arrive home to insure a convenient appointment time.  9.  If you have disability of FMLA / Family leave forms, please bring the forms to the office for processing.  (do not give to your surgeon).  WHEN TO CALL US  [phone]: Poor pain control Reactions / problems with new medications (rash/itching, nausea, etc)  Fever over 101.5 F (38.5 C) Inability to urinate Nausea and/or vomiting Worsening swelling or bruising Continued bleeding from incision. Increased pain, redness, or drainage from the incision   The clinic staff is available to answer your questions during regular business hours (8:30am-5pm).   Please don't hesitate to call and ask to speak to one of our nurses for clinical concerns.   If you have a medical emergency, go to the nearest emergency room or call 911.  A surgeon from [facility] is always on call at the hospitals in [facility], [place] [address], [address], [place], [place]  [address] ?  [address] [address], [place], [place]   [address] MAIN: [phone] ? TOLL FREE: [phone] ? FAX: [phone] [email or web address]

## 2023-11-18 NOTE — Transfer of Care (Signed)
 Immediate Anesthesia Transfer of Care Note  Patient: Paul Hensley  Procedure(s) Performed: REPAIR, HERNIA, UMBILICAL, ADULT  Patient Location: PACU  Anesthesia Type:General  Level of Consciousness: drowsy  Airway & Oxygen Therapy: Patient Spontanous Breathing and Patient connected to face mask oxygen  Post-op Assessment: Report given to RN and Post -op Vital signs reviewed and stable  Post vital signs: Reviewed and stable  Last Vitals:  Vitals Value Taken Time  BP 104/46 11/18/23 1419  Temp 36.4 C 11/18/23 1419  Pulse 58 11/18/23 1427  Resp 16 11/18/23 1427  SpO2 100 % 11/18/23 1427  Vitals shown include unfiled device data.  Last Pain:  Vitals:   11/18/23 1419  TempSrc:   PainSc: Asleep         Complications: No notable events documented.

## 2023-11-18 NOTE — Op Note (Signed)
 Operative Note  Paul Hensley  [id number]  [id number]  11/18/2023   Surgeon: Aldon Hung MD FACS   Procedure performed: umbilical hernia repair   Preop diagnosis: Chronically incarcerated umbilical hernia, fascial defect not palpable Post-op diagnosis/intraop findings: Same, containing omentum.  Fascial defect after dissection measured 1 cm in maximal diameter   Specimens: no Retained items: no  EBL: minimal cc Complications: none   Description of procedure: After obtaining informed consent the patient was taken to the operating room and placed supine on operating room table where general endotracheal anesthesia was initiated, preoperative antibiotics were administered, SCDs applied, and a formal timeout was performed.  The abdomen was prepped and draped in usual sterile fashion.  After infiltration with 0.25% Marcaine with epinephrine, a curvilinear infraumbilical incision was made and the soft tissue dissected with cautery, circumferentially isolating the hernia sac.  Cautery was then used to divide the umbilical skin from the underlying hernia sac.  The sac was skeletonized to the level of the fascia where it was excised and noted to contain chronically incarcerated but viable omentum.  Some of the omentum had to be excised with cautery, ensuring hemostasis as we went, in order to allow it to be reduced.  Once this was done, the fascia was cleared off circumferentially with cautery and the defect measured 1 cm in maximal diameter.  This was closed transversely with simple interrupted 0 Ethibonds.  Additional local was infiltrated in the surrounding fascia and soft tissue to create a field block.  Hemostasis was ensured in the wound.  The umbilical skin was sutured to the fascia with a simple interrupted 3-0 Vicryl.  The skin was closed with a running subcuticular 4 Monocryl.  Benzoin, Steri-Strips, and a light pressure dressing of gauze and tape were then applied. The patient was then  awakened, extubated and taken to PACU in stable condition.    All counts were correct at the completion of the case.

## 2023-11-18 NOTE — Anesthesia Procedure Notes (Signed)
 Procedure Name: Intubation Date/Time: 11/18/2023 1:28 PM  Performed by: Elaina Graver, CRNAPre-anesthesia Checklist: Patient identified, Emergency Drugs available, Suction available and Patient being monitored Patient Re-evaluated:Patient Re-evaluated prior to induction Oxygen Delivery Method: Circle System Utilized Preoxygenation: Pre-oxygenation with 100% oxygen Induction Type: IV induction Ventilation: Mask ventilation without difficulty Tube type: Oral Tube size: 7.5 mm Number of attempts: 1 Airway Equipment and Method: Stylet Placement Confirmation: ETT inserted through vocal cords under direct vision, positive ETCO2 and breath sounds checked- equal and bilateral Secured at: 22 cm Tube secured with: Tape Dental Injury: Teeth and Oropharynx as per pre-operative assessment

## 2023-11-18 NOTE — H&P (Signed)
 Paul Hensley [id number]   Referring Provider:  Ralene Muskrat*   Subjective   Chief Complaint: New Consultation     History of Present Illness:    Very pleasant and relatively healthy 68 year old man who presents for evaluation of an umbilical hernia.  This has been present for about 10 years and has slowly increased in size over time.  Recently he has had more discomfort from this and noticed that it is occasionally feels like tightness or band pulling across his abdomen.  No associated GI symptoms.  No previous abdominal surgeries.  He would like to have this hernia fixed.  He works in Nurse, children's.   Review of Systems: A complete review of systems was obtained from the patient.  I have reviewed this information and discussed as appropriate with the patient.  See HPI as well for other ROS.   Medical History: Past Medical History:  Diagnosis Date   GERD (gastroesophageal reflux disease)    Hypertension     There is no problem list on file for this patient.   History reviewed. No pertinent surgical history.   No Known Allergies  Current Outpatient Medications on File Prior to Visit  Medication Sig Dispense Refill   atenoloL (TENORMIN) 50 MG tablet Take 1 tablet by mouth once daily     doxazosin (CARDURA) 4 MG tablet Take 1 tablet by mouth once daily     finasteride (PROSCAR) 5 mg tablet Take 1 tablet by mouth once daily     losartan (COZAAR) 100 MG tablet Take 1 tablet by mouth once daily     meloxicam (MOBIC) 15 MG tablet Take 1 tablet by mouth once daily     omeprazole (PRILOSEC) 40 MG DR capsule Take 40 mg by mouth once daily     No current facility-administered medications on file prior to visit.    Family History  Problem Relation Age of Onset   High blood pressure (Hypertension) Sister    Diabetes Sister      Social History   Tobacco Use  Smoking Status Former   Types: Cigarettes   Start date: 1985  Smokeless Tobacco Never     Social  History   Socioeconomic History   Marital status: Married  Tobacco Use   Smoking status: Former    Types: Cigarettes    Start date: 1985   Smokeless tobacco: Never  Substance and Sexual Activity   Alcohol use: Never   Drug use: Never   Social Drivers of Corporate investment banker Strain: Low Risk  (04/26/2023)   Received from [facility]   Overall Financial Resource Strain (CARDIA)    Difficulty of Paying Living Expenses: Not very hard  Food Insecurity: No Food Insecurity (04/26/2023)   [facility]   Hunger Vital Sign    Worried About Running Out of Food in the Last Year: Never true    Ran Out of Food in the Last Year: Never true  Transportation Needs: No Transportation Needs (04/26/2023)   [facility] - [facility]    Lack of Transportation (Medical): No    Lack of Transportation (Non-Medical): No  Physical Activity: Sufficiently Active (04/26/2023)   Received from [facility]   Exercise Vital Sign    Days of Exercise per Week: 3 days    Minutes of Exercise per Session: 60 min  Social Connections: Socially Integrated (04/26/2023)   [facility] from [facility]   Social Connection and Isolation Panel [  NHANES]    Frequency of Communication with Friends and Family: More than three times a week    Frequency of Social Gatherings with Friends and Family: More than three times a week    Attends Religious Services: More than 4 times per year    Active Member of [employer] or Organizations: Yes    Attends Banker Meetings: More than 4 times per year    Marital Status: Married  Housing Stability: Unknown (10/11/2023)   Housing Stability Vital Sign    Homeless in the Last Year: No    Objective:    Vitals:   10/11/23 1014  BP: (!) 170/77  Pulse: 67  Temp: 36.9 C (98.4 F)  SpO2: 97%  Weight: 80.9 kg (178 lb 6.4 oz)  Height: 167.6 cm (5\' 6" )  PainSc: 0-No pain    Body mass index is 28.79 kg/m.  Gen: A&Ox3, no distress   Chest: respiratory effort is normal. Abdomen: soft, nondistended, nontender.  Chronically incarcerated umbilical hernia containing fat tissue which is not reducible, thinning and purple discoloration of the overlying skin Neuro: no gross deficit Psych: appropriate mood and affect, normal insight/judgment intact  Skin: warm and dry  Assessment and Plan:  Diagnoses and all orders for this visit:  Umbilical hernia without obstruction and without gangrene    We discussed the relevant anatomy and we discussed options for repair.  I recommend an open approach and went over the technique of the procedure.  Discussed risks of bleeding, infection, pain, scarring, injury to structures in the area, hernia recurrence, risk of seroma or hematoma, general cardiovascular, pulmonary, and thromboembolic complications.  We discussed typical postop recovery, timeline, and activity limitations.  We also discussed the option of ongoing observation, with high rate of ultimately returning for surgery and risk of increasing size/symptoms from the hernia as well as incarceration/strangulation and went over symptoms that should prompt the patient to seek emergency treatment.  Questions were welcomed and answered to the patient's satisfaction. Patient wishes to proceed with scheduling.   Phylliss Blakes MD FACS

## 2023-11-19 ENCOUNTER — Encounter (HOSPITAL_COMMUNITY): Payer: Self-pay | Admitting: Surgery

## 2023-11-19 NOTE — Telephone Encounter (Signed)
 Requested medication (s) are due for refill today: yes  Requested medication (s) are on the active medication list: yes  Last refill:  all were reordered 08/19/23 #90  Future visit scheduled: no  Notes to clinic:  called pt and pt stated "I was just in there" [facility] was in March but do not see issues addressed   Requested Prescriptions  Pending Prescriptions Disp Refills   losartan  (COZAAR ) 100 MG tablet [Pharmacy Med Name: Losartan  Potassium 100 MG Oral Tablet] 90 tablet 0    Sig: Take 1 tablet by mouth once daily     Cardiovascular:  Angiotensin Receptor Blockers Failed - 11/19/2023 10:05 AM      Failed - Last BP in normal range    BP Readings from Last 1 Encounters:  11/18/23 (!) 158/75         Failed - Valid encounter within last 6 months    Recent Outpatient Visits           1 month ago Umbilical hernia without obstruction and without gangrene   [place] [facility] Austine Lefort, MD   2 months ago Dysuria   [place] [facility] Amadeo June, MD   5 months ago Bilateral lower extremity edema   [place] [facility] Jenelle Mis, FNP   6 months ago Benign essential HTN   [place] [facility] Cheril Cork, Cisco Crest, MD   10 months ago Seborrheic keratoses   [place] [facility] Pickard, Cisco Crest, MD              Passed - Cr in normal range and within 180 days    Creat  Date Value Ref Range Status  04/26/2023 0.97 0.70 - 1.35 mg/dL Final   Creatinine, Ser  Date Value Ref Range Status  11/13/2023 0.81 0.61 - 1.24 mg/dL Final         Passed - K in normal range and within 180 days    Potassium  Date Value Ref Range Status  11/13/2023 4.2 3.5 - 5.1 mmol/L Final         Passed - Patient is not pregnant       doxazosin  (CARDURA ) 4 MG tablet [Pharmacy Med Name: Doxazosin  Mesylate 4 MG Oral Tablet] 90 tablet 0    Sig: Take 1 tablet by mouth once daily      Cardiovascular:  Alpha Blockers Failed - 11/19/2023 10:05 AM      Failed - Last BP in normal range    BP Readings from Last 1 Encounters:  11/18/23 (!) 158/75         Failed - Valid encounter within last 6 months    Recent Outpatient Visits           1 month ago Umbilical hernia without obstruction and without gangrene   [place] [facility] Austine Lefort, MD   2 months ago Dysuria   [place] [facility] Amadeo June, MD   5 months ago Bilateral lower extremity edema   [place] [facility] Jenelle Mis, FNP   6 months ago Benign essential HTN   [place] [facility] Cheril Cork, Cisco Crest, MD   10 months ago Seborrheic keratoses   [place] [facility] Austine Lefort, MD               atenolol  (TENORMIN ) 50 MG  tablet [Pharmacy Med Name: Atenolol  50 MG Oral Tablet] 90 tablet 0    Sig: TAKE 1 TABLET BY MOUTH ONCE DAILY . APPOINTMENT REQUIRED FOR FUTURE REFILLS     Cardiovascular: Beta Blockers 2 Failed - 11/19/2023 10:05 AM      Failed - Last BP in normal range    BP Readings from Last 1 Encounters:  11/18/23 (!) 158/75         Failed - Valid encounter within last 6 months    Recent Outpatient Visits           1 month ago Umbilical hernia without obstruction and without gangrene   [place] [facility] Cheril Cork, Cisco Crest, MD   2 months ago Dysuria   [place] [facility] Amadeo June, MD   5 months ago Bilateral lower extremity edema   [place] [facility] Jenelle Mis, FNP   6 months ago Benign essential HTN   [place] [facility] Austine Lefort, MD   10 months ago Seborrheic keratoses   [place] [facility] Pickard, Cisco Crest, MD              Passed - Cr in normal range and within 360 days    Creat  Date Value Ref Range Status  04/26/2023 0.97 0.70 -  1.35 mg/dL Final   Creatinine, Ser  Date Value Ref Range Status  11/13/2023 0.81 0.61 - 1.24 mg/dL Final         Passed - Last Heart Rate in normal range    Pulse Readings from Last 1 Encounters:  11/18/23 (!) 55

## 2023-11-21 ENCOUNTER — Telehealth: Payer: Self-pay

## 2023-11-21 NOTE — Telephone Encounter (Signed)
 Copied from CRM [id number]. Topic: Clinical - Prescription Issue >> Nov 21, 2023 10:39 AM Star East wrote: Reason for CRM: still waiting for refills on atenolol  (TENORMIN ) 50 MG tablet, doxazosin  (CARDURA ) 4 MG tabletc and losartan  (COZAAR ) 100 MG tablet- Will be out after tomorrow morning

## 2023-12-10 DIAGNOSIS — K080 Exfoliation of teeth due to systemic causes: Secondary | ICD-10-CM | POA: Diagnosis not present

## 2023-12-16 ENCOUNTER — Ambulatory Visit: Payer: Self-pay

## 2023-12-16 NOTE — Telephone Encounter (Signed)
 FYI Only or Action Required?: Action required by provider  Patient was last seen in primary care on 10/04/2023 by Austine Lefort, MD. Called Nurse Triage reporting Foot Swelling. Symptoms began a week ago. Interventions attempted: OTC medications: Tylenol , Rest, hydration, or home remedies, and Ice/heat application. Symptoms are: unchanged.  Triage Disposition: See PCP When Office is Open (Within 3 Days)  Patient/caregiver understands and will follow disposition?: No, wishes to speak with PCP  Copied from CRM [id number]. Topic: Clinical - Red Word Triage >> Dec 16, 2023  8:57 AM Elle L wrote: Red Word that prompted transfer to Nurse Triage: The patient is experiencing feet swelling that started a week ago. Reason for Disposition  [1] Small swelling or lump AND [2] unexplained AND [3] present > 1 week  Answer Assessment - Initial Assessment Questions 1. APPEARANCE of SWELLING: "What does it look like?"     Small area to the ball of left foot 2. SIZE: "How large is the swelling?" (e.g., inches, cm; or compare to size of pinhead, tip of pen, eraser, coin, pea, grape, ping pong ball)      Small area that only patient has noticed. 3. LOCATION: "Where is the swelling located?"     Ball of left foot 4. ONSET: "When did the swelling start?"     Patient noticed it a week ago 5. COLOR: "What color is it?" "Is there more than one color?"     No redness 6. PAIN: "Is there any pain?" If Yes, ask: "How bad is the pain?" (e.g., scale 1-10; or mild, moderate, severe)     - NONE (0): no pain   - MILD (1-3): doesn't interfere with normal activities    - MODERATE (4-7): interferes with normal activities or awakens from sleep    - SEVERE (8-10): excruciating pain, unable to do any normal activities     No pain 7. ITCH: "Does it itch?" If Yes, ask: "How bad is the itch?"      No itching 8. CAUSE: "What do you think caused the swelling?" Patient states feet are being treated by his foot doctor. 9 OTHER  SYMPTOMS: "Do you have any other symptoms?" (e.g., fever)     No Patient's feet are being treated by a podiatrist.  Protocols used: Skin Lump or Localized Swelling-A-AH

## 2023-12-18 DIAGNOSIS — Z4889 Encounter for other specified surgical aftercare: Secondary | ICD-10-CM | POA: Diagnosis not present

## 2023-12-23 ENCOUNTER — Telehealth: Payer: Self-pay

## 2023-12-23 ENCOUNTER — Ambulatory Visit: Payer: Self-pay

## 2023-12-23 NOTE — Telephone Encounter (Signed)
 Copied from CRM [id number]. Topic: Clinical - Medical Advice >> Dec 23, 2023  8:08 AM Loreda Rodriguez T wrote: Reason for CRM: patient called stated his left foot began to swell after eating but has started to go down over night. He is requesting a call back to see if he needs an appt with the provider   ----------------------------------------------------------------------- From previous Reason for Contact - Scheduling: Patient/patient representative is calling to schedule an appointment. Refer to attachments for appointment information      See triage note.

## 2023-12-23 NOTE — Telephone Encounter (Addendum)
 FYI Only or Action Required?: FYI only for provider  Patient was last seen in primary care on 10/04/2023 by Austine Lefort, MD. Called Nurse Triage reporting Foot Swelling. Symptoms began a week ago. Interventions attempted: Rest, hydration, or home remedies. Symptoms are: unchanged.  Triage Disposition: See PCP Within 2 Weeks  Patient/caregiver understands and will follow disposition?: Yes    Reason for Disposition  [1] MILD swelling of both ankles (i.e., pedal edema) AND [2] varicose veins  Answer Assessment - Initial Assessment Questions 1. ONSET: When did the swelling start? (e.g., minutes, hours, days)     X 1 week 2. LOCATION: What part of the leg is swollen?  Are both legs swollen or just one leg?     Left foot and ankle 3. SEVERITY: How bad is the swelling? (e.g., localized; mild, moderate, severe)   - Localized: Small area of swelling localized to one leg.   - MILD pedal edema: Swelling limited to foot and ankle, pitting edema < 1/4 inch (6 mm) deep, rest and elevation eliminate most or all swelling.   - MODERATE edema: Swelling of lower leg to knee, pitting edema > 1/4 inch (6 mm) deep, rest and elevation only partially reduce swelling.   - SEVERE edema: Swelling extends above knee, facial or hand swelling present.      moderate 4. REDNESS: Does the swelling look red or infected?     no 5. PAIN: Is the swelling painful to touch? If Yes, ask: How painful is it?   (Scale 1-10; mild, moderate or severe)     no 6. FEVER: Do you have a fever? If Yes, ask: What is it, how was it measured, and when did it start?      no 7. CAUSE: What do you think is causing the leg swelling?     Possible salt intake 8. MEDICAL HISTORY: Do you have a history of blood clots (e.g., DVT), cancer, heart failure, kidney disease, or liver failure?     N/a 9. RECURRENT SYMPTOM: Have you had leg swelling before? If Yes, ask: When was the last time? What happened that time?      no 10. OTHER SYMPTOMS: Do you have any other symptoms? (e.g., chest pain, difficulty breathing)       no 11. PREGNANCY: Is there any chance you are pregnant? When was your last menstrual period?       N/a  Pt thinks this swelling could be related to his salt intake therefore has been attempting to increase his water  intake to balance this out.  Protocols used: Leg Swelling and Edema-A-AH

## 2023-12-27 ENCOUNTER — Encounter: Payer: Self-pay | Admitting: Family Medicine

## 2023-12-27 ENCOUNTER — Ambulatory Visit (INDEPENDENT_AMBULATORY_CARE_PROVIDER_SITE_OTHER): Admitting: Family Medicine

## 2023-12-27 VITALS — BP 137/82 | HR 58 | Ht 66.0 in | Wt 178.2 lb

## 2023-12-27 DIAGNOSIS — G609 Hereditary and idiopathic neuropathy, unspecified: Secondary | ICD-10-CM

## 2023-12-27 MED ORDER — GABAPENTIN 300 MG PO CAPS: 300.0000 mg | ORAL_CAPSULE | Freq: Three times a day (TID) | ORAL | 3 refills | Status: DC | PRN

## 2023-12-27 NOTE — Progress Notes (Signed)
 Subjective:    Patient ID: Paul Hensley, male    DOB: 1956/06/13, 68 y.o.   MRN: [id number]  HPI  Patient reports an unusual sensation in both feet.  He describes it as aching tingling sensation.  Sometimes he has a burning pins-and-needles pain in the soles of his feet.  He also complains of some swelling tickly in the webspace between his left 1st and 2nd toe.  He has some mild edema in both ankles that appears to be due to venous insufficiency.  His biggest concern are the paresthesias and hyperesthesias in his feet.  The secondary concern is the asymmetric swelling near the MP joints on the left foot Past Medical History:  Diagnosis Date   GAD (generalized anxiety disorder)    Hiatal hernia    High triglycerides    Hypertension    Past Surgical History:  Procedure Laterality Date   UMBILICAL HERNIA REPAIR N/A 11/18/2023   Procedure: REPAIR, HERNIA, UMBILICAL, ADULT;  Surgeon: Adalberto Acton, MD;  Location: [facility];  Service: General;  Laterality: N/A;   Current Outpatient Medications on File Prior to Visit  Medication Sig Dispense Refill   atenolol  (TENORMIN ) 50 MG tablet TAKE 1 TABLET BY MOUTH ONCE DAILY . APPOINTMENT REQUIRED FOR FUTURE REFILLS 90 tablet 0   doxazosin  (CARDURA ) 4 MG tablet Take 1 tablet by mouth once daily 90 tablet 0   finasteride  (PROSCAR ) 5 MG tablet Take 1 tablet by mouth once daily (Patient taking differently: Take 2.5 mg by mouth in the morning.) 90 tablet 0   losartan  (COZAAR ) 100 MG tablet Take 1 tablet by mouth once daily 90 tablet 0   meloxicam  (MOBIC ) 15 MG tablet Take 1 tablet by mouth once daily (Patient taking differently: Take 15 mg by mouth daily as needed (pain/inflammation).) 90 tablet 0   omeprazole  (PRILOSEC) 40 MG capsule Take 1 capsule (40 mg total) by mouth daily. (Patient taking differently: Take 40 mg by mouth daily as needed (indigestion/heartburn.).) 90 capsule 3   pseudoephedrine (SUDAFED) 30 MG tablet Take 30-60 mg by mouth every 4  (four) hours as needed for congestion (allergies.).     No current facility-administered medications on file prior to visit.   Allergies  Allergen Reactions   Effexor [Venlafaxine Hydrochloride] Other (See Comments)    Unknown reaction   Social History   Socioeconomic History   Marital status: Married    Spouse name: Not on file   Number of children: Not on file   Years of education: Not on file   Highest education level: Not on file  Occupational History   Occupation: Retired 04/05/2023  Tobacco Use   Smoking status: Former    Current packs/day: 0.00    Types: Cigarettes    Quit date: 10/02/1971    Years since quitting: 52.2   Smokeless tobacco: Never  Vaping Use   Vaping status: Never Used  Substance and Sexual Activity   Alcohol use: No   Drug use: No   Sexual activity: Not Currently  Other Topics Concern   Not on file  Social History Narrative   Not on file   Social Drivers of Health   Financial Resource Strain: Low Risk  (04/26/2023)   Overall Financial Resource Strain (CARDIA)    Difficulty of Paying Living Expenses: Not very hard  Food Insecurity: No Food Insecurity (04/26/2023)   Hunger Vital Sign    Worried About Running Out of Food in the Last Year: Never true    Ran  Out of Food in the Last Year: Never true  Transportation Needs: No Transportation Needs (04/26/2023)   PRAPARE - Administrator, Civil Service (Medical): No    Lack of Transportation (Non-Medical): No  Physical Activity: Sufficiently Active (04/26/2023)   Exercise Vital Sign    Days of Exercise per Week: 3 days    Minutes of Exercise per Session: 60 min  Stress: Not on file  Social Connections: Socially Integrated (04/26/2023)   Social Connection and Isolation Panel    Frequency of Communication with Friends and Family: More than three times a week    Frequency of Social Gatherings with Friends and Family: More than three times a week    Attends Religious Services: More than 4  times per year    Active Member of [employer] or Organizations: Yes    Attends Banker Meetings: More than 4 times per year    Marital Status: Married  Catering manager Violence: Not At Risk (04/26/2023)   Humiliation, Afraid, Rape, and Kick questionnaire    Fear of Current or Ex-Partner: No    Emotionally Abused: No    Physically Abused: No    Sexually Abused: No   No family history on file.   Past Medical History:  Diagnosis Date   GAD (generalized anxiety disorder)    Hiatal hernia    High triglycerides    Hypertension    Past Surgical History:  Procedure Laterality Date   UMBILICAL HERNIA REPAIR N/A 11/18/2023   Procedure: REPAIR, HERNIA, UMBILICAL, ADULT;  Surgeon: Adalberto Acton, MD;  Location: [facility];  Service: General;  Laterality: N/A;   Current Outpatient Medications on File Prior to Visit  Medication Sig Dispense Refill   atenolol  (TENORMIN ) 50 MG tablet TAKE 1 TABLET BY MOUTH ONCE DAILY . APPOINTMENT REQUIRED FOR FUTURE REFILLS 90 tablet 0   doxazosin  (CARDURA ) 4 MG tablet Take 1 tablet by mouth once daily 90 tablet 0   finasteride  (PROSCAR ) 5 MG tablet Take 1 tablet by mouth once daily (Patient taking differently: Take 2.5 mg by mouth in the morning.) 90 tablet 0   losartan  (COZAAR ) 100 MG tablet Take 1 tablet by mouth once daily 90 tablet 0   meloxicam  (MOBIC ) 15 MG tablet Take 1 tablet by mouth once daily (Patient taking differently: Take 15 mg by mouth daily as needed (pain/inflammation).) 90 tablet 0   omeprazole  (PRILOSEC) 40 MG capsule Take 1 capsule (40 mg total) by mouth daily. (Patient taking differently: Take 40 mg by mouth daily as needed (indigestion/heartburn.).) 90 capsule 3   pseudoephedrine (SUDAFED) 30 MG tablet Take 30-60 mg by mouth every 4 (four) hours as needed for congestion (allergies.).     No current facility-administered medications on file prior to visit.   Allergies  Allergen Reactions   Effexor [Venlafaxine Hydrochloride]  Other (See Comments)    Unknown reaction   Social History   Socioeconomic History   Marital status: Married    Spouse name: Not on file   Number of children: Not on file   Years of education: Not on file   Highest education level: Not on file  Occupational History   Occupation: Retired 04/05/2023  Tobacco Use   Smoking status: Former    Current packs/day: 0.00    Types: Cigarettes    Quit date: 10/02/1971    Years since quitting: 52.2   Smokeless tobacco: Never  Vaping Use   Vaping status: Never Used  Substance and Sexual Activity  Alcohol use: No   Drug use: No   Sexual activity: Not Currently  Other Topics Concern   Not on file  Social History Narrative   Not on file   Social Drivers of Health   Financial Resource Strain: Low Risk  (04/26/2023)   Overall Financial Resource Strain (CARDIA)    Difficulty of Paying Living Expenses: Not very hard  Food Insecurity: No Food Insecurity (04/26/2023)   Hunger Vital Sign    Worried About Running Out of Food in the Last Year: Never true    Ran Out of Food in the Last Year: Never true  Transportation Needs: No Transportation Needs (04/26/2023)   PRAPARE - Administrator, Civil Service (Medical): No    Lack of Transportation (Non-Medical): No  Physical Activity: Sufficiently Active (04/26/2023)   Exercise Vital Sign    Days of Exercise per Week: 3 days    Minutes of Exercise per Session: 60 min  Stress: Not on file  Social Connections: Socially Integrated (04/26/2023)   Social Connection and Isolation Panel    Frequency of Communication with Friends and Family: More than three times a week    Frequency of Social Gatherings with Friends and Family: More than three times a week    Attends Religious Services: More than 4 times per year    Active Member of [employer] or Organizations: Yes    Attends Banker Meetings: More than 4 times per year    Marital Status: Married  Catering manager Violence: Not At  Risk (04/26/2023)   Humiliation, Afraid, Rape, and Kick questionnaire    Fear of Current or Ex-Partner: No    Emotionally Abused: No    Physically Abused: No    Sexually Abused: No     Review of Systems  All other systems reviewed and are negative.      Objective:   Physical Exam Vitals reviewed.  Constitutional:      General: He is not in acute distress.    Appearance: Normal appearance. He is well-developed and normal weight. He is not ill-appearing, toxic-appearing or diaphoretic.  HENT:     Head: Normocephalic and atraumatic.     Mouth/Throat:     Mouth: Mucous membranes are moist.     Pharynx: Oropharynx is clear. No oropharyngeal exudate or posterior oropharyngeal erythema.   Eyes:     Extraocular Movements: Extraocular movements intact.     Conjunctiva/sclera: Conjunctivae normal.     Pupils: Pupils are equal, round, and reactive to light.   Neck:     Vascular: No carotid bruit.   Cardiovascular:     Rate and Rhythm: Normal rate and regular rhythm.     Pulses:          Dorsalis pedis pulses are 2+ on the right side and 2+ on the left side.       Posterior tibial pulses are 2+ on the right side and 2+ on the left side.     Heart sounds: Normal heart sounds. No murmur heard.    No friction rub. No gallop.  Pulmonary:     Effort: Pulmonary effort is normal. No respiratory distress.     Breath sounds: Normal breath sounds. No stridor. No wheezing, rhonchi or rales.  Chest:     Chest wall: No tenderness.  Abdominal:     General: Bowel sounds are normal. There is no distension.     Palpations: Abdomen is soft. There is no mass.     Tenderness:  There is no abdominal tenderness. There is no guarding or rebound.     Hernia: A hernia (umbilical) is present.   Musculoskeletal:     Cervical back: Normal range of motion.     Right lower leg: No edema.     Left lower leg: No edema.     Right foot: Normal range of motion. No swelling, deformity, prominent metatarsal  heads, laceration, tenderness, bony tenderness or crepitus.     Left foot: Normal range of motion. No swelling, deformity, prominent metatarsal heads, laceration, tenderness, bony tenderness or crepitus.  Feet:     Right foot:     Skin integrity: Skin integrity normal.     Left foot:     Skin integrity: Skin integrity normal.  Lymphadenopathy:     Cervical: No cervical adenopathy.   Skin:    Coloration: Skin is not jaundiced or pale.     Findings: No bruising, erythema, lesion or rash.   Neurological:     General: No focal deficit present.     Mental Status: He is alert and oriented to person, place, and time. Mental status is at baseline.     Cranial Nerves: No cranial nerve deficit.     Sensory: No sensory deficit.     Motor: No weakness.     Coordination: Coordination normal.     Gait: Gait normal.     Deep Tendon Reflexes: Reflexes normal.   Psychiatric:        Mood and Affect: Mood normal.        Behavior: Behavior normal.        Thought Content: Thought content normal.        Judgment: Judgment normal.      Assessment & Plan:  Idiopathic peripheral neuropathy I believe the patient is dealing with neuropathy in his feet.  He cannot gabapentin  for 100 mg 3 times a day as needed for the pain.  He can meloxicam  15 mg a day for swelling and pain in the MTP joints on his left foot which I suspect are due to mild osteoarthritis in the midfoot

## 2024-01-09 ENCOUNTER — Encounter: Payer: Self-pay | Admitting: Podiatry

## 2024-01-09 ENCOUNTER — Ambulatory Visit: Admitting: Podiatry

## 2024-01-09 DIAGNOSIS — M7751 Other enthesopathy of right foot: Secondary | ICD-10-CM | POA: Diagnosis not present

## 2024-01-09 DIAGNOSIS — M7752 Other enthesopathy of left foot: Secondary | ICD-10-CM | POA: Diagnosis not present

## 2024-01-09 NOTE — Progress Notes (Signed)
 He presents today for follow-up of his capsulitis bilaterally.  He states that they hurt mostly just feel tight my primary read your note and started me on gabapentin .  He states that it made me feel like I was smoking and do the so I quit taking it.  He states he thinks it may have helped when he did take it.  Objective: Vital signs are stable he is alert oriented x 3 there is no change in physical exam.  Hammertoe deformities neuritis type symptomatology bilaterally.  Assessment: Neuritis capsulitis.  Plan: Encouraged him to take the gabapentin  just at bedtime.  If not improved next visit may need to consider pregabalin.  We did not reinject him at this point.

## 2024-01-13 ENCOUNTER — Telehealth: Payer: Self-pay | Admitting: Podiatry

## 2024-01-13 DIAGNOSIS — I872 Venous insufficiency (chronic) (peripheral): Secondary | ICD-10-CM

## 2024-01-13 NOTE — Telephone Encounter (Signed)
 Patient called back to clarify it was a referral for a venous doppler ultrasound. Says they discussed at last visit but patient had chosen to wait at the time, but had significant swelling over the weekend and is now wanting to go ahead with the referral

## 2024-01-13 NOTE — Telephone Encounter (Signed)
 I called patient to let him know I need more clarification. There aren't any visit notes about a referral. What type of referral does he need?

## 2024-01-13 NOTE — Telephone Encounter (Signed)
 Patient states left foot is swollen and he would like to follow up with the vein specialist. State referral to vein specialist was discussed at visit last week.

## 2024-01-30 DIAGNOSIS — K080 Exfoliation of teeth due to systemic causes: Secondary | ICD-10-CM | POA: Diagnosis not present

## 2024-01-31 ENCOUNTER — Ambulatory Visit (HOSPITAL_COMMUNITY)
Admission: RE | Admit: 2024-01-31 | Discharge: 2024-01-31 | Disposition: A | Discharge status: Home / Self Care | Source: Ambulatory Visit | Attending: Podiatry | Admitting: Podiatry

## 2024-01-31 DIAGNOSIS — I872 Venous insufficiency (chronic) (peripheral): Secondary | ICD-10-CM | POA: Insufficient documentation

## 2024-02-04 ENCOUNTER — Ambulatory Visit: Payer: Self-pay | Admitting: Podiatry

## 2024-02-06 NOTE — Progress Notes (Signed)
Spoke to patient and notified him of results.

## 2024-02-13 ENCOUNTER — Other Ambulatory Visit: Payer: Self-pay | Admitting: Family Medicine

## 2024-02-13 DIAGNOSIS — I10 Essential (primary) hypertension: Secondary | ICD-10-CM

## 2024-02-14 ENCOUNTER — Other Ambulatory Visit: Payer: Self-pay | Admitting: Family Medicine

## 2024-03-13 DIAGNOSIS — J309 Allergic rhinitis, unspecified: Secondary | ICD-10-CM | POA: Diagnosis not present

## 2024-03-13 DIAGNOSIS — H93A1 Pulsatile tinnitus, right ear: Secondary | ICD-10-CM | POA: Diagnosis not present

## 2024-03-13 DIAGNOSIS — H6123 Impacted cerumen, bilateral: Secondary | ICD-10-CM | POA: Diagnosis not present

## 2024-03-13 DIAGNOSIS — H903 Sensorineural hearing loss, bilateral: Secondary | ICD-10-CM | POA: Diagnosis not present

## 2024-03-13 DIAGNOSIS — H9311 Tinnitus, right ear: Secondary | ICD-10-CM | POA: Diagnosis not present

## 2024-03-19 ENCOUNTER — Ambulatory Visit (INDEPENDENT_AMBULATORY_CARE_PROVIDER_SITE_OTHER): Admitting: Podiatry

## 2024-03-19 DIAGNOSIS — I872 Venous insufficiency (chronic) (peripheral): Secondary | ICD-10-CM | POA: Diagnosis not present

## 2024-03-19 DIAGNOSIS — G5781 Other specified mononeuropathies of right lower limb: Secondary | ICD-10-CM | POA: Diagnosis not present

## 2024-03-19 DIAGNOSIS — G5782 Other specified mononeuropathies of left lower limb: Secondary | ICD-10-CM

## 2024-03-19 MED ORDER — PREGABALIN 75 MG PO CAPS: 75.0000 mg | ORAL_CAPSULE | Freq: Two times a day (BID) | ORAL | 3 refills | Status: DC

## 2024-03-19 NOTE — Progress Notes (Signed)
 He presents today states that there is minor improvement he has been taking 1 gabapentin  just at nighttime because it makes him too crazy during the day.  He states that his legs and feet are stiff.  Objective: Vital signs stable alert oriented x 3.  Pulses are palpable.  No change in physical exam.  Assessment: Neuritis bilateral.  Neuroma bilateral.  Capsulitis bilateral.  Plan: I changed his gabapentin  to 75 mg of Lyrica  to be taken twice daily.  Follow-up with him in 1 month at which time we will most likely make more changes.

## 2024-04-02 DIAGNOSIS — K080 Exfoliation of teeth due to systemic causes: Secondary | ICD-10-CM | POA: Diagnosis not present

## 2024-04-09 DIAGNOSIS — K080 Exfoliation of teeth due to systemic causes: Secondary | ICD-10-CM | POA: Diagnosis not present

## 2024-04-11 ENCOUNTER — Other Ambulatory Visit: Payer: Self-pay | Admitting: Family Medicine

## 2024-04-15 ENCOUNTER — Ambulatory Visit (INDEPENDENT_AMBULATORY_CARE_PROVIDER_SITE_OTHER)

## 2024-04-15 VITALS — Ht 66.0 in | Wt 178.0 lb

## 2024-04-15 DIAGNOSIS — Z0000 Encounter for general adult medical examination without abnormal findings: Secondary | ICD-10-CM

## 2024-04-15 NOTE — Patient Instructions (Signed)
 Paul Hensley,  Thank you for taking the time for your Medicare Wellness Visit. I appreciate your continued commitment to your health goals. Please review the care plan we discussed, and feel free to reach out if I can assist you further.  Medicare recommends these wellness visits once per year to help you and your care team stay ahead of potential health issues. These visits are designed to focus on prevention, allowing your provider to concentrate on managing your acute and chronic conditions during your regular appointments.  Please note that Annual Wellness Visits do not include a physical exam. Some assessments may be limited, especially if the visit was conducted virtually. If needed, we may recommend a separate in-person follow-up with your provider.  Ongoing Care Seeing your primary care provider every 3 to 6 months helps us  monitor your health and provide consistent, personalized care.   Referrals If a referral was made during today's visit and you haven't received any updates within two weeks, please contact the referred provider directly to check on the status.  Recommended Screenings:  Health Maintenance  Topic Date Due   COVID-19 Vaccine (3 - Pfizer risk series) 05/14/2020   Flu Shot  02/07/2024   DTaP/Tdap/Td vaccine (4 - Td or Tdap) 04/25/2024*   Hepatitis C Screening  04/25/2024*   Medicare Annual Wellness Visit  04/15/2025   Colon Cancer Screening  06/22/2032   Pneumococcal Vaccine for age over 43  Completed   Zoster (Shingles) Vaccine  Completed   Meningitis B Vaccine  Aged Out  *Topic was postponed. The date shown is not the original due date.       04/15/2024    9:45 AM  Advanced Directives  Does Patient Have a Medical Advance Directive? No  Would patient like information on creating a medical advance directive? Yes (MAU/Ambulatory/Procedural Areas - Information given)   Advance Care Planning is important because it: Ensures you receive medical care that aligns  with your values, goals, and preferences. Provides guidance to your family and loved ones, reducing the emotional burden of decision-making during critical moments.  Information on Advanced Care Planning can be found at [place]  Secretary of [facility] Advance Health Care Directives Advance Health Care Directives ([email or web address]   Vision: Annual vision screenings are recommended for early detection of glaucoma, cataracts, and diabetic retinopathy. These exams can also reveal signs of chronic conditions such as diabetes and high blood pressure.  Dental: Annual dental screenings help detect early signs of oral cancer, gum disease, and other conditions linked to overall health, including heart disease and diabetes.  Please see the attached documents for additional preventive care recommendations.

## 2024-04-15 NOTE — Progress Notes (Signed)
 Subjective:   Paul Hensley is a 68 y.o. who presents for a Medicare Wellness preventive visit.  As a reminder, Annual Wellness Visits don't include a physical exam, and some assessments may be limited, especially if this visit is performed virtually. We may recommend an in-person follow-up visit with your provider if needed.  Visit Complete: Virtual I connected with  Paul Hensley on 04/15/24 by a audio enabled telemedicine application and verified that I am speaking with the correct person using two identifiers.  Patient Location: Home  Provider Location: Home Office  I discussed the limitations of evaluation and management by telemedicine. The patient expressed understanding and agreed to proceed.  Vital Signs: Because this visit was a virtual/telehealth visit, some criteria may be missing or patient reported. Any vitals not documented were not able to be obtained and vitals that have been documented are patient reported.  VideoDeclined- This patient declined Librarian, academic. Therefore the visit was completed with audio only.  Persons Participating in Visit: Patient.  AWV Questionnaire: No: Patient Medicare AWV questionnaire was not completed prior to this visit.  Cardiac Risk Factors include: advanced age (>27men, >48 women);hypertension;male gender     Objective:    Today's Vitals   04/15/24 0940  Weight: 178 lb (80.7 kg)  Height: 5' 6 (1.676 m)   Body mass index is 28.73 kg/m.     04/15/2024    9:45 AM 11/18/2023   12:17 PM 11/13/2023    9:45 AM  Advanced Directives  Does Patient Have a Medical Advance Directive? No No No  Would patient like information on creating a medical advance directive? Yes (MAU/Ambulatory/Procedural Areas - Information given) No - Patient declined No - Patient declined    Current Medications (verified) Outpatient Encounter Medications as of 04/15/2024  Medication Sig   atenolol  (TENORMIN ) 50 MG tablet TAKE 1  TABLET BY MOUTH ONCE DAILY . APPOINTMENT REQUIRED FOR FUTURE REFILLS   doxazosin  (CARDURA ) 4 MG tablet Take 1 tablet by mouth once daily   finasteride  (PROSCAR ) 5 MG tablet Take 1 tablet by mouth once daily (Patient taking differently: Take 2.5 mg by mouth in the morning.)   losartan  (COZAAR ) 100 MG tablet Take 1 tablet by mouth once daily   meloxicam  (MOBIC ) 15 MG tablet TAKE 1 TABLET BY MOUTH DAILY AS NEEDED FOR PAIN / INFLAMMATION   omeprazole  (PRILOSEC) 40 MG capsule Take 1 capsule (40 mg total) by mouth daily. (Patient taking differently: Take 40 mg by mouth daily as needed (indigestion/heartburn.).)   pregabalin  (LYRICA ) 75 MG capsule Take 1 capsule (75 mg total) by mouth 2 (two) times daily.   pseudoephedrine (SUDAFED) 30 MG tablet Take 30-60 mg by mouth every 4 (four) hours as needed for congestion (allergies.).   gabapentin  (NEURONTIN ) 300 MG capsule Take 1 capsule (300 mg total) by mouth 3 (three) times daily as needed (nerve pain). (Patient not taking: Reported on 04/15/2024)   No facility-administered encounter medications on file as of 04/15/2024.    Allergies (verified) Effexor [venlafaxine hydrochloride]   History: Past Medical History:  Diagnosis Date   GAD (generalized anxiety disorder)    Hiatal hernia    High triglycerides    Hypertension    Past Surgical History:  Procedure Laterality Date   UMBILICAL HERNIA REPAIR N/A 11/18/2023   Procedure: REPAIR, HERNIA, UMBILICAL, ADULT;  Surgeon: Signe Mitzie LABOR, MD;  Location: [facility];  Service: General;  Laterality: N/A;   No family history on file. Social History  Socioeconomic History   Marital status: Married    Spouse name: Not on file   Number of children: Not on file   Years of education: Not on file   Highest education level: Not on file  Occupational History   Occupation: Retired 04/05/2023  Tobacco Use   Smoking status: Former    Current packs/day: 0.00    Types: Cigarettes    Quit date: 10/02/1971     Years since quitting: 52.5   Smokeless tobacco: Never  Vaping Use   Vaping status: Never Used  Substance and Sexual Activity   Alcohol use: No   Drug use: No   Sexual activity: Not Currently  Other Topics Concern   Not on file  Social History Narrative   Not on file   Social Drivers of Health   Financial Resource Strain: Low Risk  (04/15/2024)   Overall Financial Resource Strain (CARDIA)    Difficulty of Paying Living Expenses: Not very hard  Food Insecurity: No Food Insecurity (04/15/2024)   Hunger Vital Sign    Worried About Running Out of Food in the Last Year: Never true    Ran Out of Food in the Last Year: Never true  Transportation Needs: No Transportation Needs (04/15/2024)   PRAPARE - Administrator, Civil Service (Medical): No    Lack of Transportation (Non-Medical): No  Physical Activity: Sufficiently Active (04/15/2024)   Exercise Vital Sign    Days of Exercise per Week: 3 days    Minutes of Exercise per Session: 60 min  Stress: No Stress Concern Present (04/15/2024)   [employer] of Occupational Health - Occupational Stress Questionnaire    Feeling of Stress: Not at all  Social Connections: Socially Integrated (04/15/2024)   Social Connection and Isolation Panel    Frequency of Communication with Friends and Family: More than three times a week    Frequency of Social Gatherings with Friends and Family: Three times a week    Attends Religious Services: More than 4 times per year    Active Member of Clubs or Organizations: Yes    Attends Engineer, structural: More than 4 times per year    Marital Status: Married    Tobacco Counseling Counseling given: Not Answered    Clinical Intake:  Pre-visit preparation completed: Yes  Pain : No/denies pain  Diabetes: No   How often do you need to have someone help you when you read instructions, pamphlets, or other written materials from your doctor or pharmacy?: 1 - Never  Interpreter  Needed?: No  Information entered by :: Charmaine Bloodgood LPN   Activities of Daily Living \    04/15/2024    9:44 AM 11/13/2023    9:48 AM  In your present state of health, do you have any difficulty performing the following activities:  Hearing? 0   Vision? 0   Difficulty concentrating or making decisions? 0   Walking or climbing stairs? 0   Dressing or bathing? 0   Doing errands, shopping? 0 0  Preparing Food and eating ? N   Using the Toilet? N   In the past six months, have you accidently leaked urine? N   Do you have problems with loss of bowel control? N   Managing your Medications? N   Managing your Finances? N   Housekeeping or managing your Housekeeping? N     Patient Care Team: Duanne Butler DASEN, MD as PCP - General (Family Medicine) [facility] [place],  Max T, DPM as Consulting Physician (Podiatry)  I have updated your Care Teams any recent Medical Services you may have received from other providers in the past year.     Assessment:   This is a routine wellness examination for M[place].  Hearing/Vision screen Hearing Screening - Comments:: Some hearing difficulty; seen by ENT, no hearing aids hx of tinnitus  Vision Screening - Comments:: up to date with routine eye exams with [facility]    Goals Addressed             This Visit's Progress    Remain active and independent   On track      Depression Screen     04/15/2024    9:45 AM 04/26/2023   11:53 AM 04/26/2023   11:16 AM 01/18/2023    9:09 AM 07/27/2022   11:05 AM 05/11/2022    8:26 AM 08/26/2020    8:27 AM  PHQ 2/9 Scores  PHQ - 2 Score 0 0 0 3 0 0 0  PHQ- 9 Score    16       Fall Risk     04/15/2024    9:46 AM 04/26/2023   11:52 AM 04/26/2023   11:16 AM 01/18/2023    9:09 AM 07/27/2022   11:05 AM  Fall Risk   Falls in the past year? 0 0 0 0 0  Number falls in past yr: 0 0 0 0 0  Injury with Fall? 0  0 0 0  Risk for fall due to : No Fall Risks  No Fall Risks No Fall Risks  No Fall Risks  Follow up Falls prevention discussed;Education provided;Falls evaluation completed  Falls prevention discussed Falls prevention discussed Falls prevention discussed      Data saved with a previous flowsheet row definition    MEDICARE RISK AT HOME:  Medicare Risk at Home Any stairs in or around the home?: Yes If so, are there any without handrails?: No Home free of loose throw rugs in walkways, pet beds, electrical cords, etc?: Yes Adequate lighting in your home to reduce risk of falls?: Yes Life alert?: No Use of a cane, walker or w/c?: No Grab bars in the bathroom?: Yes Shower chair or bench in shower?: No Elevated toilet seat or a handicapped toilet?: Yes  TIMED UP AND GO:  Was the test performed?  No  Cognitive Function: 6CIT completed        04/15/2024    9:46 AM  6CIT Screen  What Year? 0 points  What month? 0 points  What time? 0 points  Count back from 20 0 points  Months in reverse 0 points  Repeat phrase 0 points  Total Score 0 points    Immunizations Immunization History  Administered Date(s) Administered   Fluad Quad(high Dose 65+) 04/08/2021, 04/08/2021   Fluad Trivalent(High Dose 65+) 04/26/2023   Influenza, Quadrivalent, Recombinant, Inj, Pf 03/10/2018   Influenza,inj,Quad PF,6+ Mos 03/23/2016, 03/11/2017   Influenza-Unspecified 03/11/2017   PFIZER(Purple Top)SARS-COV-2 Vaccination 03/19/2020, 04/16/2020   PNEUMOCOCCAL CONJUGATE-20 05/11/2022   Td 07/31/2005   Tdap 07/31/2005, 07/09/2010   Zoster Recombinant(Shingrix) 01/28/2018, 04/20/2018   Zoster, Live 12/02/2015    Screening Tests Health Maintenance  Topic Date Due   COVID-19 Vaccine (3 - Pfizer risk series) 05/14/2020   Influenza Vaccine  02/07/2024   DTaP/Tdap/Td (4 - Td or Tdap) 04/25/2024 (Originally 07/09/2020)   Hepatitis C Screening  04/25/2024 (Originally 05/03/1974)   Medicare Annual Wellness (AWV)  04/15/2025  Colonoscopy  06/22/2032   Pneumococcal Vaccine: 50+  Years  Completed   Zoster Vaccines- Shingrix  Completed   Meningococcal B Vaccine  Aged Out    Health Maintenance Items Addressed: Vaccines Due: Flu and TDap   Additional Screening:  Vision Screening: Recommended annual ophthalmology exams for early detection of glaucoma and other disorders of the eye. Is the patient up to date with their annual eye exam?  Yes  Who is the provider or what is the name of the office in which the patient attends annual eye exams? [facility]   Dental Screening: Recommended annual dental exams for proper oral hygiene  Community Resource Referral / Chronic Care Management: CRR required this visit?  No   CCM required this visit?  No   Plan:    I have personally reviewed and noted the following in the patient's chart:   Medical and social history Use of alcohol, tobacco or illicit drugs  Current medications and supplements including opioid prescriptions. Patient is not currently taking opioid prescriptions. Functional ability and status Nutritional status Physical activity Advanced directives List of other physicians Hospitalizations, surgeries, and ER visits in previous 12 months Vitals Screenings to include cognitive, depression, and falls Referrals and appointments  In addition, I have reviewed and discussed with patient certain preventive protocols, quality metrics, and best practice recommendations. A written personalized care plan for preventive services as well as general preventive health recommendations were provided to patient.   Lavelle Pfeiffer [place], [place]   89/07/7972   After Visit Summary: (MyChart) Due to this being a telephonic visit, the after visit summary with patients personalized plan was offered to patient via MyChart   Notes: Nothing significant to report at this time.

## 2024-04-21 ENCOUNTER — Ambulatory Visit: Admitting: Podiatry

## 2024-04-21 DIAGNOSIS — G5781 Other specified mononeuropathies of right lower limb: Secondary | ICD-10-CM | POA: Diagnosis not present

## 2024-04-21 DIAGNOSIS — G5782 Other specified mononeuropathies of left lower limb: Secondary | ICD-10-CM | POA: Diagnosis not present

## 2024-04-21 MED ORDER — PREGABALIN 150 MG PO CAPS: 150.0000 mg | ORAL_CAPSULE | Freq: Two times a day (BID) | ORAL | 3 refills | Status: AC

## 2024-04-21 NOTE — Progress Notes (Signed)
 He presents today for follow-up of his Lyrica  that we just started.  He states that the tingling in the zapping is less than it was before he says still gets zaps occasionally and still has some difficulty standing all day.  Objective: Vital signs are stable he is alert and oriented x 3.  Pulses are palpable.  No change in physical exam.  Assessment: Idiopathic neuropathy bilateral lower extremity.  Plan: We will increase his Lyrica  to 150 mg tablets 1 tablet twice daily and I will follow-up with him in 1 month to 6 weeks.  Questions or concerns he will notify us  immediately.

## 2024-05-09 ENCOUNTER — Other Ambulatory Visit: Payer: Self-pay | Admitting: Family Medicine

## 2024-05-09 DIAGNOSIS — I10 Essential (primary) hypertension: Secondary | ICD-10-CM

## 2024-05-11 ENCOUNTER — Ambulatory Visit (INDEPENDENT_AMBULATORY_CARE_PROVIDER_SITE_OTHER): Admitting: Family Medicine

## 2024-05-11 ENCOUNTER — Encounter: Payer: Self-pay | Admitting: Family Medicine

## 2024-05-11 VITALS — BP 136/64 | HR 68 | Temp 97.8°F | Ht 66.0 in | Wt 190.6 lb

## 2024-05-11 DIAGNOSIS — I10 Essential (primary) hypertension: Secondary | ICD-10-CM | POA: Diagnosis not present

## 2024-05-11 NOTE — Progress Notes (Signed)
 Subjective:    Patient ID: Paul Hensley, male    DOB: [birth year], 68 y.o.   MRN: [id number]  Medication Refill    Patient is requesting I removed 2 lesions from his body.  He has a brown wartlike papule on his left shoulder that is roughly 6 mm in diameter.  He also has a brown hyperkeratotic papule on his medial right thigh just above his knee there is approximately 4 mm in diameter.  Both appear to be seborrheic keratoses however they annoy him and he would like them removed.  His blood pressure today is adequately controlled at 136/64.  He denies any chest pain shortness of breath or dyspnea on exertion.  He is due for fasting lab work.  He would also like to get his flu shot today. Past Medical History:  Diagnosis Date   GAD (generalized anxiety disorder)    Hiatal hernia    High triglycerides    Hypertension    Past Surgical History:  Procedure Laterality Date   UMBILICAL HERNIA REPAIR N/A 11/18/2023   Procedure: REPAIR, HERNIA, UMBILICAL, ADULT;  Surgeon: Signe Mitzie LABOR, MD;  Location: [facility];  Service: General;  Laterality: N/A;   Current Outpatient Medications on File Prior to Visit  Medication Sig Dispense Refill   atenolol  (TENORMIN ) 50 MG tablet TAKE 1 TABLET BY MOUTH ONCE DAILY . APPOINTMENT REQUIRED FOR FUTURE REFILLS 90 tablet 0   doxazosin  (CARDURA ) 4 MG tablet Take 1 tablet by mouth once daily 90 tablet 0   finasteride  (PROSCAR ) 5 MG tablet Take 1 tablet by mouth once daily 90 tablet 0   losartan  (COZAAR ) 100 MG tablet Take 1 tablet by mouth once daily 90 tablet 0   meloxicam  (MOBIC ) 15 MG tablet TAKE 1 TABLET BY MOUTH DAILY AS NEEDED FOR PAIN / INFLAMMATION 30 tablet 0   omeprazole  (PRILOSEC) 40 MG capsule Take 1 capsule (40 mg total) by mouth daily. 90 capsule 3   pregabalin  (LYRICA ) 150 MG capsule Take 1 capsule (150 mg total) by mouth 2 (two) times daily. 60 capsule 3   pseudoephedrine (SUDAFED) 30 MG tablet Take 30-60 mg by mouth every 4 (four) hours as needed  for congestion (allergies.).     No current facility-administered medications on file prior to visit.   Allergies  Allergen Reactions   Effexor [Venlafaxine Hydrochloride] Other (See Comments)    Unknown reaction   Social History   Socioeconomic History   Marital status: Married    Spouse name: Not on file   Number of children: Not on file   Years of education: Not on file   Highest education level: Not on file  Occupational History   Occupation: Retired 04/05/2023  Tobacco Use   Smoking status: Former    Current packs/day: 0.00    Types: Cigarettes    Quit date: 10/02/1971    Years since quitting: 52.6   Smokeless tobacco: Never  Vaping Use   Vaping status: Never Used  Substance and Sexual Activity   Alcohol use: No   Drug use: No   Sexual activity: Not Currently  Other Topics Concern   Not on file  Social History Narrative   Not on file   Social Drivers of Health   Financial Resource Strain: Low Risk  (04/15/2024)   Overall Financial Resource Strain (CARDIA)    Difficulty of Paying Living Expenses: Not very hard  Food Insecurity: No Food Insecurity (04/15/2024)   Hunger Vital Sign    Worried About Running  Out of Food in the Last Year: Never true    Ran Out of Food in the Last Year: Never true  Transportation Needs: No Transportation Needs (04/15/2024)   PRAPARE - Administrator, Civil Service (Medical): No    Lack of Transportation (Non-Medical): No  Physical Activity: Sufficiently Active (04/15/2024)   Exercise Vital Sign    Days of Exercise per Week: 3 days    Minutes of Exercise per Session: 60 min  Stress: No Stress Concern Present (04/15/2024)   [employer] of Occupational Health - Occupational Stress Questionnaire    Feeling of Stress: Not at all  Social Connections: Socially Integrated (04/15/2024)   Social Connection and Isolation Panel    Frequency of Communication with Friends and Family: More than three times a week    Frequency of  Social Gatherings with Friends and Family: Three times a week    Attends Religious Services: More than 4 times per year    Active Member of Clubs or Organizations: Yes    Attends Banker Meetings: More than 4 times per year    Marital Status: Married  Catering Manager Violence: Not At Risk (04/15/2024)   Humiliation, Afraid, Rape, and Kick questionnaire    Fear of Current or Ex-Partner: No    Emotionally Abused: No    Physically Abused: No    Sexually Abused: No   No family history on file.   Past Medical History:  Diagnosis Date   GAD (generalized anxiety disorder)    Hiatal hernia    High triglycerides    Hypertension    Past Surgical History:  Procedure Laterality Date   UMBILICAL HERNIA REPAIR N/A 11/18/2023   Procedure: REPAIR, HERNIA, UMBILICAL, ADULT;  Surgeon: Signe Mitzie LABOR, MD;  Location: [facility];  Service: General;  Laterality: N/A;   Current Outpatient Medications on File Prior to Visit  Medication Sig Dispense Refill   atenolol  (TENORMIN ) 50 MG tablet TAKE 1 TABLET BY MOUTH ONCE DAILY . APPOINTMENT REQUIRED FOR FUTURE REFILLS 90 tablet 0   doxazosin  (CARDURA ) 4 MG tablet Take 1 tablet by mouth once daily 90 tablet 0   finasteride  (PROSCAR ) 5 MG tablet Take 1 tablet by mouth once daily 90 tablet 0   losartan  (COZAAR ) 100 MG tablet Take 1 tablet by mouth once daily 90 tablet 0   meloxicam  (MOBIC ) 15 MG tablet TAKE 1 TABLET BY MOUTH DAILY AS NEEDED FOR PAIN / INFLAMMATION 30 tablet 0   omeprazole  (PRILOSEC) 40 MG capsule Take 1 capsule (40 mg total) by mouth daily. 90 capsule 3   pregabalin  (LYRICA ) 150 MG capsule Take 1 capsule (150 mg total) by mouth 2 (two) times daily. 60 capsule 3   pseudoephedrine (SUDAFED) 30 MG tablet Take 30-60 mg by mouth every 4 (four) hours as needed for congestion (allergies.).     No current facility-administered medications on file prior to visit.   Allergies  Allergen Reactions   Effexor [Venlafaxine Hydrochloride]  Other (See Comments)    Unknown reaction   Social History   Socioeconomic History   Marital status: Married    Spouse name: Not on file   Number of children: Not on file   Years of education: Not on file   Highest education level: Not on file  Occupational History   Occupation: Retired 04/05/2023  Tobacco Use   Smoking status: Former    Current packs/day: 0.00    Types: Cigarettes    Quit date: 10/02/1971  Years since quitting: 52.6   Smokeless tobacco: Never  Vaping Use   Vaping status: Never Used  Substance and Sexual Activity   Alcohol use: No   Drug use: No   Sexual activity: Not Currently  Other Topics Concern   Not on file  Social History Narrative   Not on file   Social Drivers of Health   Financial Resource Strain: Low Risk  (04/15/2024)   Overall Financial Resource Strain (CARDIA)    Difficulty of Paying Living Expenses: Not very hard  Food Insecurity: No Food Insecurity (04/15/2024)   Hunger Vital Sign    Worried About Running Out of Food in the Last Year: Never true    Ran Out of Food in the Last Year: Never true  Transportation Needs: No Transportation Needs (04/15/2024)   PRAPARE - Administrator, Civil Service (Medical): No    Lack of Transportation (Non-Medical): No  Physical Activity: Sufficiently Active (04/15/2024)   Exercise Vital Sign    Days of Exercise per Week: 3 days    Minutes of Exercise per Session: 60 min  Stress: No Stress Concern Present (04/15/2024)   [employer] of Occupational Health - Occupational Stress Questionnaire    Feeling of Stress: Not at all  Social Connections: Socially Integrated (04/15/2024)   Social Connection and Isolation Panel    Frequency of Communication with Friends and Family: More than three times a week    Frequency of Social Gatherings with Friends and Family: Three times a week    Attends Religious Services: More than 4 times per year    Active Member of Clubs or Organizations: Yes    Attends  Banker Meetings: More than 4 times per year    Marital Status: Married  Catering Manager Violence: Not At Risk (04/15/2024)   Humiliation, Afraid, Rape, and Kick questionnaire    Fear of Current or Ex-Partner: No    Emotionally Abused: No    Physically Abused: No    Sexually Abused: No     Review of Systems  All other systems reviewed and are negative.      Objective:   Physical Exam Vitals reviewed.  Constitutional:      General: He is not in acute distress.    Appearance: Normal appearance. He is well-developed and normal weight. He is not ill-appearing, toxic-appearing or diaphoretic.  HENT:     Head: Normocephalic and atraumatic.     Mouth/Throat:     Mouth: Mucous membranes are moist.     Pharynx: Oropharynx is clear. No oropharyngeal exudate or posterior oropharyngeal erythema.  Eyes:     Extraocular Movements: Extraocular movements intact.     Conjunctiva/sclera: Conjunctivae normal.     Pupils: Pupils are equal, round, and reactive to light.  Neck:     Vascular: No carotid bruit.  Cardiovascular:     Rate and Rhythm: Normal rate and regular rhythm.     Pulses:          Dorsalis pedis pulses are 2+ on the right side and 2+ on the left side.       Posterior tibial pulses are 2+ on the right side and 2+ on the left side.     Heart sounds: Normal heart sounds. No murmur heard.    No friction rub. No gallop.  Pulmonary:     Effort: Pulmonary effort is normal. No respiratory distress.     Breath sounds: Normal breath sounds. No stridor. No wheezing, rhonchi or rales.  Chest:     Chest wall: No tenderness.  Abdominal:     General: Bowel sounds are normal. There is no distension.     Palpations: Abdomen is soft. There is no mass.     Tenderness: There is no abdominal tenderness. There is no guarding or rebound.     Hernia: A hernia (umbilical) is present.  Musculoskeletal:     Cervical back: Normal range of motion.     Right lower leg: No edema.      Left lower leg: No edema.     Right foot: Normal range of motion. No swelling, deformity, prominent metatarsal heads, laceration, tenderness, bony tenderness or crepitus.     Left foot: Normal range of motion. No swelling, deformity, prominent metatarsal heads, laceration, tenderness, bony tenderness or crepitus.  Feet:     Right foot:     Skin integrity: Skin integrity normal.     Left foot:     Skin integrity: Skin integrity normal.  Lymphadenopathy:     Cervical: No cervical adenopathy.  Skin:    Coloration: Skin is not jaundiced or pale.     Findings: No bruising, erythema, lesion or rash.  Neurological:     General: No focal deficit present.     Mental Status: He is alert and oriented to person, place, and time. Mental status is at baseline.     Cranial Nerves: No cranial nerve deficit.     Sensory: No sensory deficit.     Motor: No weakness.     Coordination: Coordination normal.     Gait: Gait normal.     Deep Tendon Reflexes: Reflexes normal.  Psychiatric:        Mood and Affect: Mood normal.        Behavior: Behavior normal.        Thought Content: Thought content normal.        Judgment: Judgment normal.      Assessment & Plan:  Benign essential HTN - Plan: Comprehensive metabolic panel with GFR, CBC with Differential/Platelet, Lipid panel Patient has a 6 mm SK on his left shoulder and a 4 mm SK on his medial right thigh.  I explained to the patient that these are not malignant.  However he request that they be removed.  I anesthetized the skin with 0.1% lidocaine  with epinephrine .  I removed each of the lesions with a razor blade down to the underlying dermis and achieved hemostasis with Drysol and a Band-Aid.  The patient will not be charged for these procedures.  I will check baseline lab work today including a CBC a CMP and a lipid panel.  I am happy with his blood pressure.  The patient received his flu shot today.

## 2024-05-12 ENCOUNTER — Ambulatory Visit: Payer: Self-pay | Admitting: Family Medicine

## 2024-05-12 LAB — CBC WITH DIFFERENTIAL/PLATELET
Absolute Lymphocytes: 1927 {cells}/uL (ref 850–3900)
Absolute Monocytes: 836 {cells}/uL (ref 200–950)
Basophils Absolute: 26 {cells}/uL (ref 0–200)
Basophils Relative: 0.3 %
Eosinophils Absolute: 282 {cells}/uL (ref 15–500)
Eosinophils Relative: 3.2 %
HCT: 44 % (ref 38.5–50.0)
Hemoglobin: 14.9 g/dL (ref 13.2–17.1)
MCH: 30.2 pg (ref 27.0–33.0)
MCHC: 33.9 g/dL (ref 32.0–36.0)
MCV: 89.2 fL (ref 80.0–100.0)
MPV: 10.5 fL (ref 7.5–12.5)
Monocytes Relative: 9.5 %
Neutro Abs: 5729 {cells}/uL (ref 1500–7800)
Neutrophils Relative %: 65.1 %
Platelets: 227 Thousand/uL (ref 140–400)
RBC: 4.93 Million/uL (ref 4.20–5.80)
RDW: 12.2 % (ref 11.0–15.0)
Total Lymphocyte: 21.9 %
WBC: 8.8 Thousand/uL (ref 3.8–10.8)

## 2024-05-12 LAB — COMPREHENSIVE METABOLIC PANEL WITH GFR
AG Ratio: 1.9 (calc) (ref 1.0–2.5)
ALT: 17 U/L (ref 9–46)
AST: 16 U/L (ref 10–35)
Albumin: 3.9 g/dL (ref 3.6–5.1)
Alkaline phosphatase (APISO): 76 U/L (ref 35–144)
BUN: 10 mg/dL (ref 7–25)
CO2: 29 mmol/L (ref 20–32)
Calcium: 8.9 mg/dL (ref 8.6–10.3)
Chloride: 103 mmol/L (ref 98–110)
Creat: 1.02 mg/dL (ref 0.70–1.35)
Globulin: 2.1 g/dL (ref 1.9–3.7)
Glucose, Bld: 90 mg/dL (ref 65–99)
Potassium: 4.1 mmol/L (ref 3.5–5.3)
Sodium: 137 mmol/L (ref 135–146)
Total Bilirubin: 0.7 mg/dL (ref 0.2–1.2)
Total Protein: 6 g/dL — ABNORMAL LOW (ref 6.1–8.1)
eGFR: 80 mL/min/1.73m2 (ref >=60)

## 2024-05-12 LAB — LIPID PANEL
Cholesterol: 132 mg/dL (ref <200)
HDL: 34 mg/dL — ABNORMAL LOW (ref >=40)
LDL Cholesterol (Calc): 74 mg/dL
Non-HDL Cholesterol (Calc): 98 mg/dL (ref <130)
Total CHOL/HDL Ratio: 3.9 (calc) (ref <5.0)
Triglycerides: 161 mg/dL — ABNORMAL HIGH (ref <150)

## 2024-05-12 NOTE — Telephone Encounter (Signed)
 Requested Prescriptions  Pending Prescriptions Disp Refills   atenolol  (TENORMIN ) 50 MG tablet [Pharmacy Med Name: Atenolol  50 MG Oral Tablet] 90 tablet 0    Sig: Take 1 tablet by mouth once daily     Cardiovascular: Beta Blockers 2 Passed - 05/12/2024  8:08 AM      Passed - Cr in normal range and within 360 days    Creat  Date Value Ref Range Status  05/11/2024 1.02 0.70 - 1.35 mg/dL Final         Passed - Last BP in normal range    BP Readings from Last 1 Encounters:  05/11/24 136/64         Passed - Last Heart Rate in normal range    Pulse Readings from Last 1 Encounters:  05/11/24 68         Passed - Valid encounter within last 6 months    Recent Outpatient Visits           Yesterday Benign essential HTN   [place] [facility] Duanne Butler DASEN, MD   4 months ago Idiopathic peripheral neuropathy   [place] [facility] Duanne Butler DASEN, MD   7 months ago Umbilical hernia without obstruction and without gangrene   [place] [facility] Duanne, Butler DASEN, MD   8 months ago Dysuria   [place] [facility] Aletha Bene, MD   11 months ago Bilateral lower extremity edema   [place] [facility] Kayla Jeoffrey RAMAN, FNP               doxazosin  (CARDURA ) 4 MG tablet [Pharmacy Med Name: Doxazosin  Mesylate 4 MG Oral Tablet] 90 tablet 0    Sig: Take 1 tablet by mouth once daily     Cardiovascular:  Alpha Blockers Passed - 05/12/2024  8:08 AM      Passed - Last BP in normal range    BP Readings from Last 1 Encounters:  05/11/24 136/64         Passed - Valid encounter within last 6 months    Recent Outpatient Visits           Yesterday Benign essential HTN   [place] [facility] Duanne, Butler DASEN, MD   4 months ago Idiopathic peripheral neuropathy   [place] [facility] Duanne Butler DASEN, MD   7 months ago Umbilical hernia without  obstruction and without gangrene   [place] [facility] Duanne Butler DASEN, MD   8 months ago Dysuria    [facility] Aletha Bene, MD   11 months ago Bilateral lower extremity edema    [facility] Kayla Jeoffrey RAMAN, FNP               losartan  (COZAAR ) 100 MG tablet [Pharmacy Med Name: Losartan  Potassium 100 MG Oral Tablet] 90 tablet 0    Sig: Take 1 tablet by mouth once daily     Cardiovascular:  Angiotensin Receptor Blockers Passed - 05/12/2024  8:08 AM      Passed - Cr in normal range and within 180 days    Creat  Date Value Ref Range Status  05/11/2024 1.02 0.70 - 1.35 mg/dL Final         Passed - K in normal range and within 180 days    Potassium  Date Value Ref Range Status  05/11/2024 4.1  3.5 - 5.3 mmol/L Final         Passed - Patient is not pregnant      Passed - Last BP in normal range    BP Readings from Last 1 Encounters:  05/11/24 136/64         Passed - Valid encounter within last 6 months    Recent Outpatient Visits           Yesterday Benign essential HTN   [place] [facility] Duanne Butler DASEN, MD   4 months ago Idiopathic peripheral neuropathy   [place] [facility] Duanne Butler DASEN, MD   7 months ago Umbilical hernia without obstruction and without gangrene   [place] [facility] Duanne Butler DASEN, MD   8 months ago Dysuria   [place] [facility] Aletha Bene, MD   11 months ago Bilateral lower extremity edema   [place] [facility] Kayla Jeoffrey RAMAN, [place]

## 2024-06-02 ENCOUNTER — Encounter: Payer: Self-pay | Admitting: Podiatry

## 2024-06-02 ENCOUNTER — Ambulatory Visit: Admitting: Podiatry

## 2024-06-02 DIAGNOSIS — M19071 Primary osteoarthritis, right ankle and foot: Secondary | ICD-10-CM | POA: Diagnosis not present

## 2024-06-02 DIAGNOSIS — M19072 Primary osteoarthritis, left ankle and foot: Secondary | ICD-10-CM

## 2024-06-02 MED ORDER — TRIAMCINOLONE ACETONIDE 40 MG/ML IJ SUSP
40.0000 mg | Freq: Once | INTRAMUSCULAR | Status: AC
  Administered 2024-06-02: 40 mg

## 2024-06-02 NOTE — Progress Notes (Signed)
 He presents today for follow-up of his neuropathy stating that the sharp shooting pains have subsided significantly.  He states that he may get 1 occasionally.  He states that these 2 toes hurt pretty bad right here as he points to the 1st and 2nd metatarsal phalangeal joints.  States that particularly in bed at night but the covers pulling back on the toes and makes them really sore.  Objective: Vital signs are stable he is alert and oriented x 3 pulses are palpable.  He has palpable osteoarthritic changes to the first metatarsal phalangeal joint as well as tenderness on end range of motion of the second metatarsal phalangeal joint.  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  Resolving neuropathic symptoms with the use of Lyrica  150 mg twice daily.  Plan: Continue Lyrica  150 mg twice daily.  Also injected around the 1st and 2nd metatarsal phalangeal joints today with 20 mg Kenalog  5 mg Marcaine  for maximal tenderness.  Tolerated procedure well I will follow-up with him in about 2 months to make sure he is doing well.

## 2024-06-21 ENCOUNTER — Other Ambulatory Visit: Payer: Self-pay | Admitting: Family Medicine

## 2024-06-24 DIAGNOSIS — K080 Exfoliation of teeth due to systemic causes: Secondary | ICD-10-CM | POA: Diagnosis not present

## 2024-08-04 ENCOUNTER — Ambulatory Visit: Admitting: Podiatry

## 2024-08-11 ENCOUNTER — Ambulatory Visit: Admitting: Podiatry

## 2024-08-11 ENCOUNTER — Encounter: Payer: Self-pay | Admitting: Podiatry

## 2024-08-11 DIAGNOSIS — M19072 Primary osteoarthritis, left ankle and foot: Secondary | ICD-10-CM | POA: Diagnosis not present

## 2024-08-11 DIAGNOSIS — M19071 Primary osteoarthritis, right ankle and foot: Secondary | ICD-10-CM

## 2024-08-11 MED ORDER — TRIAMCINOLONE ACETONIDE 40 MG/ML IJ SUSP
40.0000 mg | Freq: Once | INTRAMUSCULAR | Status: AC
  Administered 2024-08-11: 40 mg

## 2024-08-11 NOTE — Progress Notes (Signed)
 He presents today for follow-up of his osteoarthritis of the bilateral foot and neuritis.  He states my joints are starting to get tight again but the medication does seem to help with the tingling sensations.  Occasionally now and again I will get cramps and I will also get a zapping between my toes is he points to the 1st and 2nd toes.  Objective: Vitals are stable alert oriented x 3 1st and 2nd interdigital spaces are tender to.  He also has tenderness on range of motion of the 1st and 2nd metatarsal phalangeal joints.  He has medial deviation of the second digit of the right foot with some dorsal contracture.  Assessment: Pain in limb secondary to osteoarthritis neuritis digital contraction and neuropathy.  Plan: Consider extensor release of the contracted joint second metatarsophalangeal joint of the right foot.  Also injected around the second metatarsophalangeal joint today with 5 mg of Kenalog  and local anesthetic.  We discussed continuing his pregabalin  as well as drinking more electrolyte replacement.

## 2024-08-14 ENCOUNTER — Other Ambulatory Visit: Payer: Self-pay | Admitting: Podiatry

## 2024-08-14 ENCOUNTER — Other Ambulatory Visit: Payer: Self-pay | Admitting: Family Medicine

## 2024-08-14 DIAGNOSIS — I10 Essential (primary) hypertension: Secondary | ICD-10-CM

## 2024-11-10 ENCOUNTER — Ambulatory Visit: Admitting: Podiatry

## 2025-04-21 ENCOUNTER — Ambulatory Visit
